# Patient Record
Sex: Female | Born: 1945 | Race: White | Hispanic: No | Marital: Married | Smoking: Former smoker
Health system: Southern US, Community
[De-identification: ages and names within clinical notes are randomized; demographics above are authoritative.]

## PROBLEM LIST (undated history)

## (undated) DIAGNOSIS — D696 Thrombocytopenia, unspecified: Secondary | ICD-10-CM

## (undated) DIAGNOSIS — M1990 Unspecified osteoarthritis, unspecified site: Secondary | ICD-10-CM

## (undated) DIAGNOSIS — E559 Vitamin D deficiency, unspecified: Secondary | ICD-10-CM

## (undated) DIAGNOSIS — I471 Supraventricular tachycardia, unspecified: Secondary | ICD-10-CM

## (undated) DIAGNOSIS — C44621 Squamous cell carcinoma of skin of unspecified upper limb, including shoulder: Secondary | ICD-10-CM

## (undated) DIAGNOSIS — M549 Dorsalgia, unspecified: Secondary | ICD-10-CM

## (undated) DIAGNOSIS — B029 Zoster without complications: Secondary | ICD-10-CM

## (undated) DIAGNOSIS — E785 Hyperlipidemia, unspecified: Secondary | ICD-10-CM

## (undated) DIAGNOSIS — M8580 Other specified disorders of bone density and structure, unspecified site: Secondary | ICD-10-CM

## (undated) HISTORY — DX: Dorsalgia, unspecified: M54.9

## (undated) HISTORY — DX: Supraventricular tachycardia: I47.1

## (undated) HISTORY — DX: Squamous cell carcinoma of skin of unspecified upper limb, including shoulder: C44.621

## (undated) HISTORY — PX: CATARACT EXTRACTION: SUR2

## (undated) HISTORY — DX: Thrombocytopenia, unspecified: D69.6

## (undated) HISTORY — DX: Zoster without complications: B02.9

## (undated) HISTORY — PX: OTHER SURGICAL HISTORY: SHX169

## (undated) HISTORY — DX: Unspecified osteoarthritis, unspecified site: M19.90

## (undated) HISTORY — DX: Vitamin D deficiency, unspecified: E55.9

## (undated) HISTORY — DX: Other specified disorders of bone density and structure, unspecified site: M85.80

## (undated) HISTORY — DX: Supraventricular tachycardia, unspecified: I47.10

## (undated) HISTORY — PX: DILATION AND CURETTAGE OF UTERUS: SHX78

## (undated) HISTORY — PX: TONSILLECTOMY: SHX5217

## (undated) HISTORY — DX: Hyperlipidemia, unspecified: E78.5

---

## 2006-07-22 ENCOUNTER — Ambulatory Visit (HOSPITAL_COMMUNITY): Admission: RE | Admit: 2006-07-22 | Discharge: 2006-07-22 | Payer: Self-pay | Admitting: *Deleted

## 2006-07-28 ENCOUNTER — Emergency Department (HOSPITAL_COMMUNITY): Admission: EM | Admit: 2006-07-28 | Discharge: 2006-07-28 | Payer: Self-pay | Admitting: Emergency Medicine

## 2006-08-09 ENCOUNTER — Encounter: Admission: RE | Admit: 2006-08-09 | Discharge: 2006-08-09 | Payer: Self-pay | Admitting: *Deleted

## 2006-08-10 ENCOUNTER — Encounter: Admission: RE | Admit: 2006-08-10 | Discharge: 2006-11-08 | Payer: Self-pay | Admitting: Orthopedic Surgery

## 2006-08-17 ENCOUNTER — Emergency Department (HOSPITAL_COMMUNITY): Admission: EM | Admit: 2006-08-17 | Discharge: 2006-08-18 | Payer: Self-pay | Admitting: Emergency Medicine

## 2006-09-17 HISTORY — PX: VERTEBROPLASTY: SHX113

## 2006-09-23 ENCOUNTER — Encounter: Admission: RE | Admit: 2006-09-23 | Discharge: 2006-09-23 | Payer: Self-pay | Admitting: Orthopedic Surgery

## 2006-09-29 ENCOUNTER — Encounter: Admission: RE | Admit: 2006-09-29 | Discharge: 2006-09-29 | Payer: Self-pay | Admitting: Orthopedic Surgery

## 2006-11-22 HISTORY — PX: CARDIOVASCULAR STRESS TEST: SHX262

## 2007-01-25 ENCOUNTER — Encounter: Admission: RE | Admit: 2007-01-25 | Discharge: 2007-02-16 | Payer: Self-pay | Admitting: Orthopedic Surgery

## 2007-09-20 ENCOUNTER — Ambulatory Visit (HOSPITAL_COMMUNITY): Admission: RE | Admit: 2007-09-20 | Discharge: 2007-09-20 | Payer: Self-pay | Admitting: Obstetrics

## 2008-05-31 IMAGING — CT CT L SPINE W/O CM
4 of 9 series · 13 of 33 positions shown, 15 images · non-contrast
Comparison: 07/28/2006

LUMBAR SPINE CT WITHOUT CONTRAST:

CLINICAL DATA: L1 compression fracture. Low back pain.
TECHNIQUE: Multidetector CT imaging of the lumbar spine was performed. 
Sagittal and coronal plane reformatted images were reconstructed from the axial
CT data, and were also reviewed.

[Series 2: l-spine helical · axial · 0.27mm/px · z∈[+100,+163]mm · 2 of 77 slices shown]
[im 26/77  bone]
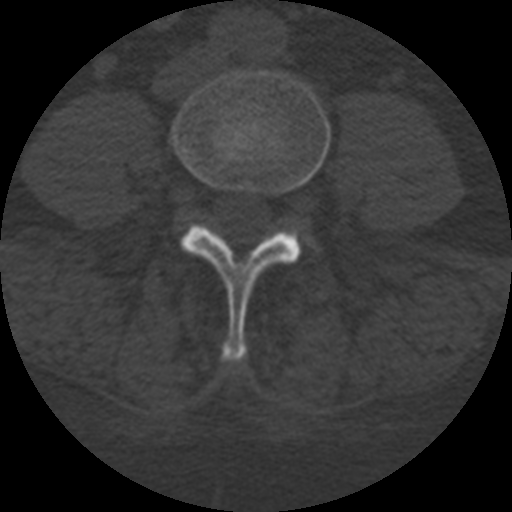
[im 51/77  bone]
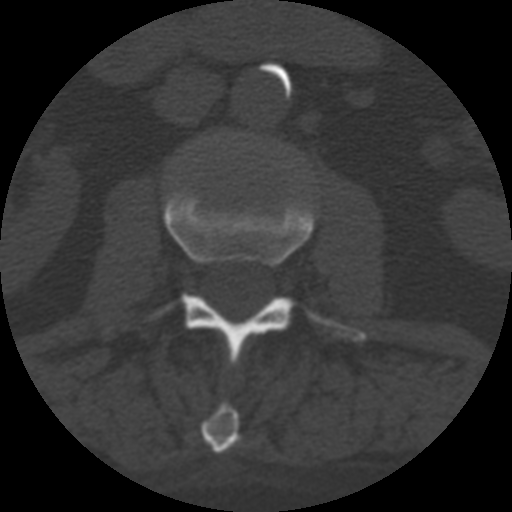

[Series 3: bone windows · axial · 0.27mm/px · z∈[+86,+180]mm · 3 of 77 slices shown, 4 images]
[im 20/77  soft-tissue]
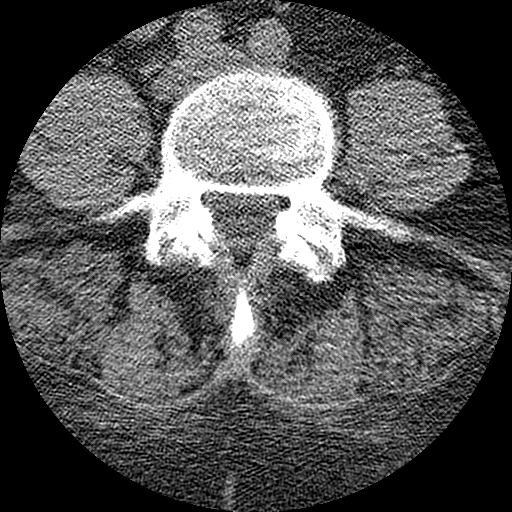
[im 20/77  bone]
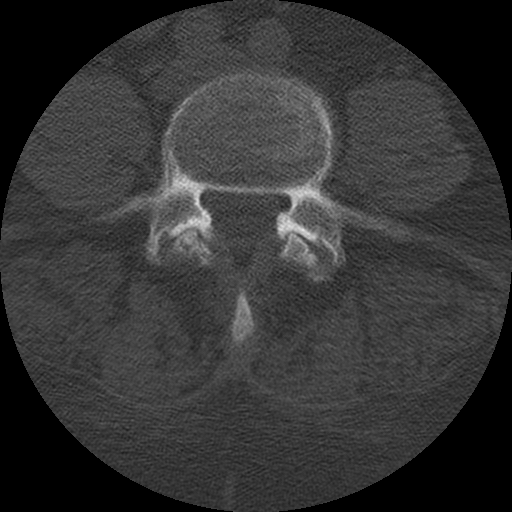
[im 39/77  bone]
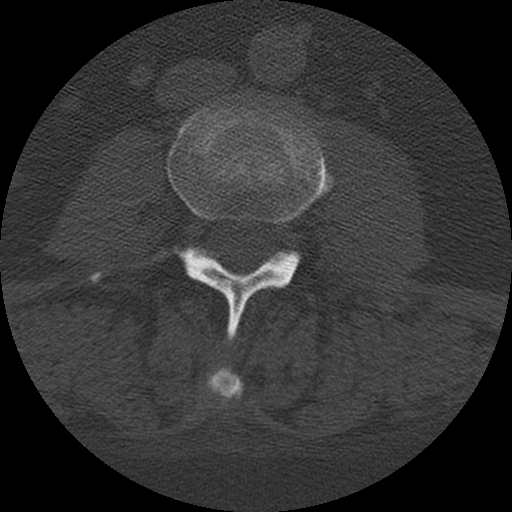
[im 58/77  bone]
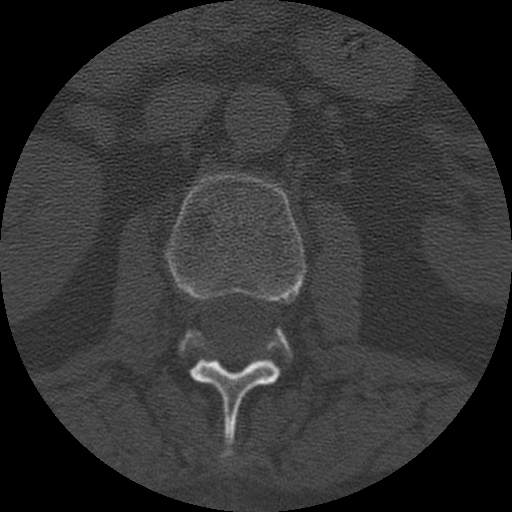

[Series 400: coronal · coronal · 0.38mm/px · 3 of 40 slices shown]
[im 8/40  bone]
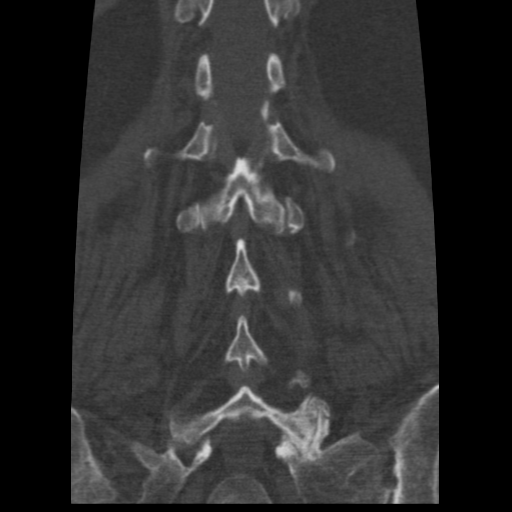
[im 16/40  bone]
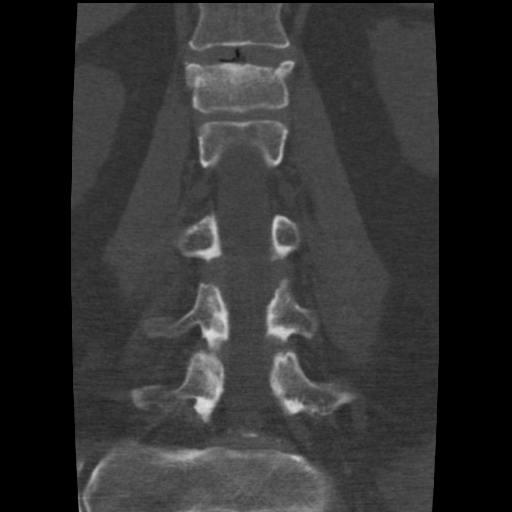
[im 24/40  bone]
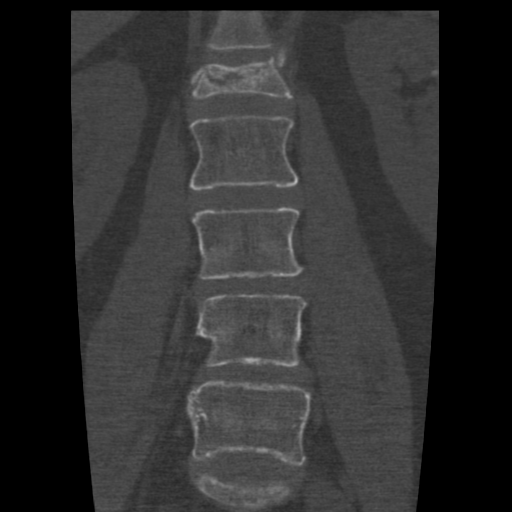

[Series 401: sagittal · sagittal · 0.38mm/px · 5 of 40 slices shown, 6 images]
[im 14/40  bone]
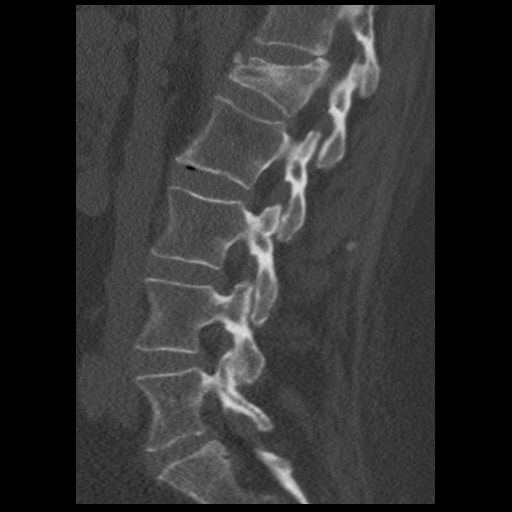
[im 17/40  bone]
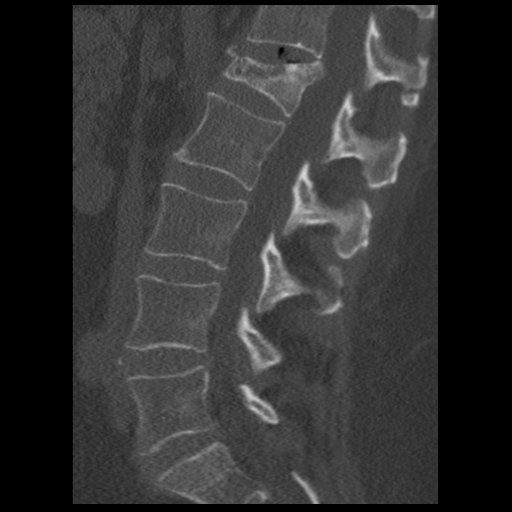
[im 20/40  soft-tissue]
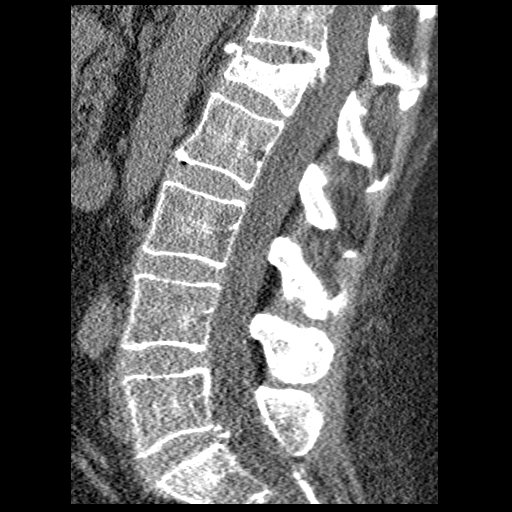
[im 20/40  bone]
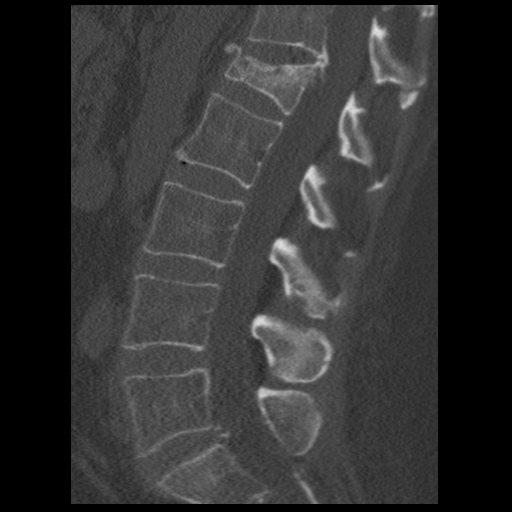
[im 23/40  bone]
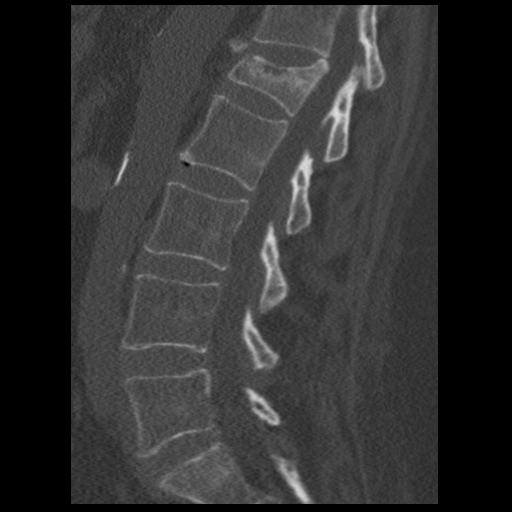
[im 27/40  bone]
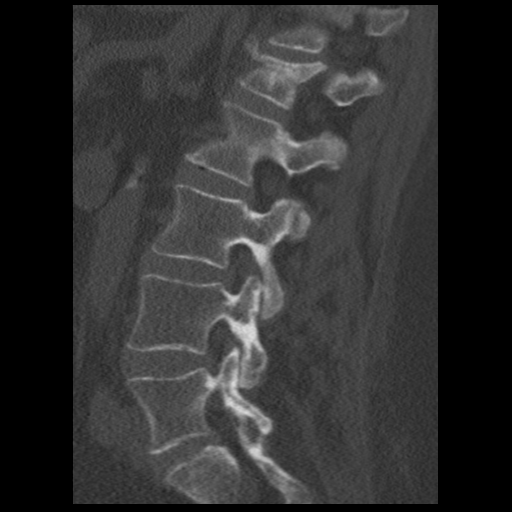

[13 of 33 positions shown; findings below may reference images not displayed]

Sagittal reformations show further loss of vertebral body height at the level of
L1. The vertebral body now has an anterior wedge configuration. Loss of anterior
cortical height is 50-75%. Loss of posterior cortical height is in the 25%
range. Posterior cortex is retropulsed approximately 6 mm into the anterior
spinal canal. The patient is developed gas within the T12-L1 interspace.

Axial Images: 

T11-12: Not Imaged in axial plane

T12-L1: Posterior bony ridging is seen at the endplates. The superior aspect of
the posterior L1 cortex is displaced 5-6 mm into the anterior canal. This
generates mild to moderate mass-effect on the anterior thecal sac.

L1-2: Broad-based disc protrusion. The central canal and neural foramina are
patent.

L2-3: No central canal or foraminal stenosis.

L3-4: Left paracentral and foraminal disc protrusion encroaches into the
inferior left neuroforamen.

L4-5: Annular bulging disc with some mild facet degeneration and thickening of
ligamentum flavum. There is mild multifactorial canal stenosis with patent
neuroforamina.

L5-S1: Small central bony spur. There is no central canal or foraminal
narrowing.

Comments: The patient is noted to have 2 nonobstructing stones in the upper pole
of the left kidney.
IMPRESSION: Further loss of vertebral body height at T1 approaching 75% loss of height
anteriorly and about 25% loss of height posteriorly. There is 5 - 6 mm of
posterior bony retropulsion into the anterior canal.

Nonobstructing left renal stones.

## 2009-12-03 ENCOUNTER — Ambulatory Visit: Payer: Self-pay | Admitting: Family Medicine

## 2009-12-06 ENCOUNTER — Ambulatory Visit: Payer: Self-pay | Admitting: Family Medicine

## 2009-12-17 DIAGNOSIS — B029 Zoster without complications: Secondary | ICD-10-CM

## 2009-12-17 HISTORY — DX: Zoster without complications: B02.9

## 2009-12-23 ENCOUNTER — Ambulatory Visit: Payer: Self-pay | Admitting: Family Medicine

## 2009-12-30 ENCOUNTER — Ambulatory Visit: Payer: Self-pay | Admitting: Family Medicine

## 2010-01-28 ENCOUNTER — Encounter
Admission: RE | Admit: 2010-01-28 | Discharge: 2010-01-28 | Payer: Self-pay | Source: Home / Self Care | Attending: Obstetrics | Admitting: Obstetrics

## 2010-02-19 ENCOUNTER — Ambulatory Visit
Admission: RE | Admit: 2010-02-19 | Discharge: 2010-02-19 | Payer: Self-pay | Source: Home / Self Care | Attending: Family Medicine | Admitting: Family Medicine

## 2010-03-09 ENCOUNTER — Encounter: Payer: Self-pay | Admitting: Radiology

## 2010-03-09 ENCOUNTER — Encounter: Payer: Self-pay | Admitting: *Deleted

## 2010-04-16 ENCOUNTER — Encounter: Payer: Self-pay | Admitting: Family Medicine

## 2010-04-16 ENCOUNTER — Encounter (INDEPENDENT_AMBULATORY_CARE_PROVIDER_SITE_OTHER): Payer: PRIVATE HEALTH INSURANCE | Admitting: Family Medicine

## 2010-04-16 DIAGNOSIS — E119 Type 2 diabetes mellitus without complications: Secondary | ICD-10-CM

## 2010-04-16 DIAGNOSIS — D649 Anemia, unspecified: Secondary | ICD-10-CM

## 2010-04-16 DIAGNOSIS — Z0000 Encounter for general adult medical examination without abnormal findings: Secondary | ICD-10-CM

## 2010-04-16 DIAGNOSIS — Z79899 Other long term (current) drug therapy: Secondary | ICD-10-CM

## 2010-04-16 DIAGNOSIS — Z23 Encounter for immunization: Secondary | ICD-10-CM

## 2010-05-19 LAB — HM COLONOSCOPY

## 2010-07-15 ENCOUNTER — Other Ambulatory Visit: Payer: PRIVATE HEALTH INSURANCE

## 2010-07-15 DIAGNOSIS — E782 Mixed hyperlipidemia: Secondary | ICD-10-CM

## 2010-07-15 DIAGNOSIS — E559 Vitamin D deficiency, unspecified: Secondary | ICD-10-CM

## 2010-07-15 DIAGNOSIS — D649 Anemia, unspecified: Secondary | ICD-10-CM

## 2010-07-16 ENCOUNTER — Other Ambulatory Visit: Payer: Self-pay | Admitting: *Deleted

## 2010-07-16 DIAGNOSIS — I10 Essential (primary) hypertension: Secondary | ICD-10-CM

## 2010-07-16 LAB — CBC WITH DIFFERENTIAL/PLATELET
Basophils Absolute: 0 10*3/uL (ref 0.0–0.1)
Basophils Relative: 0 % (ref 0–1)
Eosinophils Absolute: 0.6 10*3/uL (ref 0.0–0.7)
Lymphocytes Relative: 20 % (ref 12–46)
Lymphs Abs: 1.5 10*3/uL (ref 0.7–4.0)
MCH: 27.3 pg (ref 26.0–34.0)
MCHC: 32.6 g/dL (ref 30.0–36.0)
Monocytes Absolute: 0.5 10*3/uL (ref 0.1–1.0)
Monocytes Relative: 6 % (ref 3–12)
Neutro Abs: 4.7 10*3/uL (ref 1.7–7.7)
RDW: 17 % — ABNORMAL HIGH (ref 11.5–15.5)
WBC: 7.2 10*3/uL (ref 4.0–10.5)

## 2010-07-16 LAB — LIPID PANEL
LDL Cholesterol: 91 mg/dL (ref 0–99)
Total CHOL/HDL Ratio: 2.6 Ratio
VLDL: 27 mg/dL (ref 0–40)

## 2010-07-16 MED ORDER — LOVASTATIN 40 MG PO TABS: 40.0000 mg | ORAL_TABLET | Freq: Every day | ORAL | Status: DC

## 2010-07-17 ENCOUNTER — Ambulatory Visit (INDEPENDENT_AMBULATORY_CARE_PROVIDER_SITE_OTHER): Payer: PRIVATE HEALTH INSURANCE | Admitting: Family Medicine

## 2010-07-17 ENCOUNTER — Encounter: Payer: Self-pay | Admitting: Family Medicine

## 2010-07-17 VITALS — BP 120/74 | HR 88 | Ht 64.0 in | Wt 233.0 lb

## 2010-07-17 DIAGNOSIS — G4700 Insomnia, unspecified: Secondary | ICD-10-CM

## 2010-07-17 DIAGNOSIS — F411 Generalized anxiety disorder: Secondary | ICD-10-CM | POA: Insufficient documentation

## 2010-07-17 DIAGNOSIS — E119 Type 2 diabetes mellitus without complications: Secondary | ICD-10-CM

## 2010-07-17 DIAGNOSIS — D696 Thrombocytopenia, unspecified: Secondary | ICD-10-CM

## 2010-07-17 DIAGNOSIS — E7800 Pure hypercholesterolemia, unspecified: Secondary | ICD-10-CM

## 2010-07-17 DIAGNOSIS — D509 Iron deficiency anemia, unspecified: Secondary | ICD-10-CM | POA: Insufficient documentation

## 2010-07-17 NOTE — Progress Notes (Signed)
Subjective:    Patient ID: Kim Soto, female    DOB: 10/30/1945, 64 y.o.   MRN: [id number]  HPI Anemia follow-up:  Seeing Dr. Loreta Ave, has had EGD, colonoscopy and capsule endoscopy.  Taking a Rx iron supplement BID (patient later called with name--ferrous sulfate 325mg ).  She was also informed by Dr. Loreta Ave that she has a ventral and umbilical hernia and has concerns regarding these diagnosis.  Very stressed--has started seeing Cephus Slater (therapist).  Husband is about to lose his job, trying to move out of bad neighborhood, looking at group home for her son.  Rash on R ankle recurred yesterday, due to stress.  Gets periodically when stressed; very itchy; has Rx creams to use.  Is off the Lorazepam.  Taking Alteril (melatonin, valerian root and ?something else) and this is helping her sleep.  She has been up every 2 hours to go to the bathroom, although usually able to get back to sleep easily.  Since she is sleeping better at night, she is more active and doing more during the day, so is having more knee pain. Taking oxycodone, using just one pill daily, now earlier in the day (because active and more painful) rather than at bedtime.  Not driving after taking.  No longer needing a second pill.  Admits to dropping out of [employer] and eating poorly due to stress. Has gained 3# per her home scale (states that our last weight was 5# less than what she weighed at home).  R thumb nail is growing abnormally (due to underlying cyst).  S/p biopsy by Dr. Harlan Stains.  Still having ongoing pain, so referred to Dr. Teressa Senter for removal of the cyst, has appointment tomorrow.  Diabetes follow-up:  Blood sugars are only checked infrequently, usually <120, but hasn't been checking in the last month, when her diet has been worse.  She checks her A1c's at home, 5.7 last week. Denies hypoglycemia.  Denies polydipsia.  Having nocturia, up every 2 hours at night.  Last eye exam was earlier this month with Dr.  Elmer Picker.   Started using rubber exercise bands (saw on Dr. Neil Crouch) and is using on upper and lower body  Past Medical History  Diagnosis Date  . SVT (supraventricular tachycardia)     followed by Dr. [place]  . Hyperlipidemia   . Diabetes mellitus   . Osteopenia   . Unspecified vitamin D deficiency   . Back pain   . Arthritis     knees  . Glaucoma 2006    narrow angle, s/p laser treatment  . Shingles 12/2009    Past Surgical History  Procedure Date  . Vertebroplasty 8/08    L2  . Tonsillectomy   . Dilation and curettage of uterus     x3    History   Social History  . Marital Status: Married    Spouse Name: N/A    Number of Children: N/A  . Years of Education: N/A   Occupational History  . Not on file.   Social History Main Topics  . Smoking status: Former Smoker    Quit date: 02/16/1990  . Smokeless tobacco: Never Used  . Alcohol Use: No  . Drug Use: No  . Sexually Active: Not on file     1 PPD x 20 years   Other Topics Concern  . Not on file   Social History Narrative  . No narrative on file    Family History  Problem Relation Age of Onset  .  COPD Mother   . Heart disease Father     MI at 45, CHF  . COPD Brother   . Diabetes Brother   . Cancer Paternal Aunt     ovarian  . Diabetes Brother     Current outpatient prescriptions:alendronate (FOSAMAX) 70 MG tablet, Take 70 mg by mouth every 7 (seven) days. Take with a full glass of water on an empty stomach. , Disp: , Rfl: ;  calcium citrate-vitamin D (CITRACAL+D) 315-200 MG-UNIT per tablet, Take 2 tablets by mouth daily.  , Disp: , Rfl: ;  flecainide (TAMBOCOR) 100 MG tablet, Take 100 mg by mouth 2 (two) times daily.  , Disp: , Rfl:  lovastatin (MEVACOR) 40 MG tablet, Take 1 tablet (40 mg total) by mouth at bedtime., Disp: 30 tablet, Rfl: 2;  metFORMIN (GLUCOPHAGE) 500 MG tablet, Take 500 mg by mouth 2 (two) times daily with a meal.  , Disp: , Rfl: ;  Methylcellulose, Laxative, (CITRUCEL PO), Take 2 tablets  by mouth as needed.  , Disp: , Rfl: ;  oxyCODONE-acetaminophen (PERCOCET) 10-325 MG per tablet, Take 1 tablet by mouth 2 (two) times daily as needed.  , Disp: , Rfl:  LORazepam (ATIVAN) 1 MG tablet, Take 1 mg by mouth every 8 (eight) hours as needed.  , Disp: , Rfl:   No Known Allergies  Review of Systems Reflux only if overeats.  Denies abdominal pain.  Denies any blood in stool (black from iron).  Nocturia (q2 hours at night, fine for 4 hours at a time during the day).  Noticed some increased bruising.  No nosebleeds, visible blood in urine or stool, or other bleeding.  Denies chest pain, palpitations, swelling, or other concerns.  Denies headaches, dizziness.    Objective:   Physical Exam  Well developed, well nourished patient, in no distress BP 120/74  Pulse 88  Ht 5\' 4"  (1.626 m)  Wt 233 lb (105.688 kg)  BMI 39.99 kg/m2 Neck: No lymphadenopathy or thyromegaly, no carotid bruit Heart:  Regular rate and rhythm, no murmurs, rubs, gallops or ectopy Lungs:  Clear bilaterally, without wheezes, rales or ronchi Abdomen:  Soft, nontender, nondistended, no hepatosplenomegaly or masses, normal bowel sounds.  Obese.  Large ventral hernia noted, nontender, easily reducible.  Small umbilical hernia, nontender, easily reducible Extremities:  No clubbing, cyanosis or edema, 2+ pulses.  Neuro:  Alert and oriented x 3, cranial nerves grossly intact. Normal gait. Skin: Mild erythema at R ankle, without any discrete rash or lesions.  Occasional ecchymosis on UE Psych:  Normal mood, affect, hygiene and grooming, normal speech, eye contact  See lab results done earlier this week     Assessment & Plan:   1. Iron deficiency anemia, unspecified  CBC with Differential, Ferritin   EGD (mild erosions and HH), normal colonoscopy, and had capsule endoscopy (no report, ok per pt). Anemia resolved, on iron  2. Thrombocytopenia     New onset. Check into OTC meds to see if can affect platelets.  Re-check 1  month, sooner if increased bleeding/bruising. Hematology referral if worse  3. Type II or unspecified type diabetes mellitus without mention of complication, not stated as uncontrolled    4. Pure hypercholesterolemia    5. Anxiety state, unspecified     continue counseling with Cephus Slater  6. Insomnia     improved with use of Alteril (tryptophan, valerian, melatonin)    Anemia--has resolved.  No source found.  Only abnormality was mild gastric erosions.  Not sure  how good capsule endoscopy was (no report received).  I recommended she use the Prevacid samples given by Dr. Loreta Ave for 1-2 months.  Decrease iron to once daily.  Re-check CBC and ferritin in 2 months.  If still all okay, then can try stopping the iron entirely.  After visit, changed recommendation to f/u CBC in ONE month, rather than 2, in order to re-check the platelets sooner.  Vitamin D deficiency--adequately replaced.  Continue 2000 IU daily  DM--overall well controlled.  Needs to get back on track with her eating/food choices.  Continue counseling to help with stress reduction and coping mechanisms that do not involve eating  Knee arthritis--had originally wanted referral to pain management.  I will continue to Rx, doesn't need Rx now.  Will eventually follow up with Dr. Luiz Blare to discuss other treatment options  Ventral hernia and Umbilical hernia--patient reassured.  Signs and symptoms of herniation/strangulation reviewed.  Weight loss may help decrease size.  6 months--lipids, a1c, urine microalb 2 months--CBC and ferritin

## 2010-07-18 DIAGNOSIS — C44621 Squamous cell carcinoma of skin of unspecified upper limb, including shoulder: Secondary | ICD-10-CM

## 2010-07-18 HISTORY — DX: Squamous cell carcinoma of skin of unspecified upper limb, including shoulder: C44.621

## 2010-07-24 ENCOUNTER — Other Ambulatory Visit: Payer: Self-pay | Admitting: Orthopedic Surgery

## 2010-07-24 ENCOUNTER — Ambulatory Visit (HOSPITAL_BASED_OUTPATIENT_CLINIC_OR_DEPARTMENT_OTHER)
Admission: RE | Admit: 2010-07-24 | Discharge: 2010-07-24 | Disposition: A | Discharge status: Home / Self Care | Payer: Medicare HMO | Source: Ambulatory Visit | Attending: Orthopedic Surgery | Admitting: Orthopedic Surgery

## 2010-07-24 DIAGNOSIS — L608 Other nail disorders: Secondary | ICD-10-CM | POA: Insufficient documentation

## 2010-07-24 DIAGNOSIS — L03019 Cellulitis of unspecified finger: Secondary | ICD-10-CM | POA: Insufficient documentation

## 2010-07-25 ENCOUNTER — Telehealth: Payer: Self-pay | Admitting: Family Medicine

## 2010-07-25 NOTE — Telephone Encounter (Signed)
Okay to phone in refill--please see paper chart to see quantity she got at last refill, and refill same quantity (I can't recall quant, and not in computer). Thanks

## 2010-07-28 ENCOUNTER — Other Ambulatory Visit: Payer: Self-pay | Admitting: *Deleted

## 2010-07-28 ENCOUNTER — Other Ambulatory Visit: Payer: Self-pay

## 2010-07-28 DIAGNOSIS — G894 Chronic pain syndrome: Secondary | ICD-10-CM

## 2010-07-28 MED ORDER — OXYCODONE-ACETAMINOPHEN 10-325 MG PO TABS: 1.0000 | ORAL_TABLET | Freq: Two times a day (BID) | ORAL | Status: DC | PRN

## 2010-07-28 NOTE — Telephone Encounter (Signed)
PT PICK UP 07/28/2010

## 2010-07-29 NOTE — H&P (Signed)
NAME:  Kim Soto, Kim Soto NO.:  [id number]  MEDICAL RECORD NO.:  [id number]  LOCATION:                                 FACILITY:  PHYSICIAN:  Katy Fitch. Esau Fridman, M.D.      DATE OF BIRTH:  DATE OF ADMISSION: DATE OF DISCHARGE:                             HISTORY & PHYSICAL   PREOPERATIVE DIAGNOSIS:  Chronic dystrophic right thumbnail radial aspect with nonadherence nail plate and episodes of chronic paronychia.  POSTOPERATIVE DIAGNOSIS:  Chronic dystrophic right thumbnail radial aspect with nonadherence nail plate and episodes of chronic paronychia.  OPERATION:  Permanent resection of the dorsal intermediate and ventral nail fold and reconstruction of radial nail wall right thumb.  Also, the entire nail mechanism was sent for biopsy to rule out a neoplasm.  OPERATING SURGEON:  Katy Fitch. Annakate Soulier, MD  ASSISTANT:  Marveen Reeks Dasnoit, PA  ANESTHESIA:  Lidocaine 2% metacarpal head level block which ultimately was unsuccessful in relieving pain followed by 0.25% Marcaine radial block of right thumb.  ANESTHETIST:  Katy Fitch. 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 Bufford Buttner for evaluation and management of a chronically dystrophic right thumb nail.  Kim Soto has had have multiple episodes of drainage from her radial nail wall and a dystrophic ragged unstable nail plate.  Clinical examination revealed signs of scarring of the ventral nail matrix and radial nail wall with a friable nail plate.  We recommended permanent resection of the dorsal intermediate and ventral nail fold along the radial aspect of the nail mechanism, and we will send the nail for biopsy to be certain there is not a neoplasm of the proximal nail fold.  After informed consent, she is brought to the operating room at this time.  PROCEDURE:  Kim Soto was brought to room six of the [facility] and  placed in supine position on the operating table.  Following informed consent and Betadine prep, 2% lidocaine was infiltrated with metacarpal head level to obtain a digital block.  After more than 15 minutes, she had anesthesia of her pulp, but we could not achieve anesthesia of the dorsal aspect of the distal phalanx and dorsal nail fold.  We infiltrated an additional 2-3 mL along the pathway of the lateral antebrachial cutaneous nerves and the dorsal radial sensory nerves. Despite this, we could not achieve anesthesia.  Therefore, we requested 0.25% Marcaine without epinephrine.  A total of 2 mL of 0.25% Marcaine without epinephrine was infiltrated along the radial aspect of the thumb at the proximal phalangeal segment to obtain block of the radial proper digital nerve and the dorsal radial sensory branches.  After 5 minutes, complete anesthesia was achieved.  The right hand and arm were then prepped with Betadine soap and solution and sterilely draped.  Following exsanguination of the thumb with a gauze wrap, a 1/2-inch Penrose drain was placed in the proximal phalangeal segment of the thumb as a digital tourniquet.  With the aid of loupe magnification, we identified the area of dystrophic nail matrix.  The nail plate was incised with scissors and the scalpel and the  entire dorsal intermediate and ventral nail matrix as well as the radial nail wall was resected en bloc, placed in formalin, and passed off pathologic evaluation.  A microcurette was then used to remove any remnants of nail tissue from the distal phalanx until subcutaneous fat was visible.  Likewise, the dorsal nail fold elements were removed until subcutaneous tissue was noted.  The radial nail wall was then recontoured with a notch skin resection distally to allow proper nail wall to the reformed.  This was then reconstructed with multiple mattress sutures of 5-0 chromic.  The tourniquet was released and bleeding  controlled by direct pressure. Xeroflo was applied to the wound including the nail matrix and nail plate.  Sterile gauze and a wrist-based Coban dressing were applied. There were no apparent complications.  For aftercare, Ms. Iglesia was advised to expect the thumb to be numb for approximately 12 hours due to the Marcaine.  Thereafter, she was provided prescription for Percocet 5 mg one p.o. q.4-6 hours p.r.n. pain.  She is told to expect throbbing discomfort.     Katy Fitch Szymon Foiles, M.D.     RVS/MEDQ  D:  07/24/2010  T:  07/25/2010  Job:  [id number]  cc:   Bufford Buttner, MD  Electronically Signed by Josephine Igo M.D. on 07/29/2010 02:28:03 PM

## 2010-07-30 ENCOUNTER — Ambulatory Visit
Admission: RE | Admit: 2010-07-30 | Discharge: 2010-07-30 | Disposition: A | Discharge status: Home / Self Care | Payer: 59 | Source: Ambulatory Visit | Attending: Orthopedic Surgery | Admitting: Orthopedic Surgery

## 2010-07-30 ENCOUNTER — Encounter: Payer: Self-pay | Admitting: Family Medicine

## 2010-07-30 ENCOUNTER — Encounter (HOSPITAL_BASED_OUTPATIENT_CLINIC_OR_DEPARTMENT_OTHER)
Admission: RE | Admit: 2010-07-30 | Discharge: 2010-07-30 | Disposition: A | Discharge status: Home / Self Care | Payer: Medicare HMO | Source: Ambulatory Visit | Attending: Orthopedic Surgery | Admitting: Orthopedic Surgery

## 2010-07-30 ENCOUNTER — Other Ambulatory Visit: Payer: Self-pay | Admitting: Orthopedic Surgery

## 2010-07-30 DIAGNOSIS — Z01811 Encounter for preprocedural respiratory examination: Secondary | ICD-10-CM

## 2010-07-30 LAB — BASIC METABOLIC PANEL
CO2: 28 mEq/L (ref 19–32)
Calcium: 8.9 mg/dL (ref 8.4–10.5)
Chloride: 104 mEq/L (ref 96–112)
Creatinine, Ser: 0.65 mg/dL (ref 0.4–1.2)
Glucose, Bld: 139 mg/dL — ABNORMAL HIGH (ref 70–99)
Sodium: 140 mEq/L (ref 135–145)

## 2010-07-30 LAB — DIFFERENTIAL
Lymphs Abs: 1.4 10*3/uL (ref 0.7–4.0)
Monocytes Relative: 7 % (ref 3–12)
Neutro Abs: 5.5 10*3/uL (ref 1.7–7.7)
Neutrophils Relative %: 67 % (ref 43–77)

## 2010-07-30 LAB — CBC
Hemoglobin: 14.5 g/dL (ref 12.0–15.0)
MCH: 28.3 pg (ref 26.0–34.0)
MCV: 83.8 fL (ref 78.0–100.0)
RBC: 5.13 MIL/uL — ABNORMAL HIGH (ref 3.87–5.11)

## 2010-07-31 ENCOUNTER — Ambulatory Visit (HOSPITAL_BASED_OUTPATIENT_CLINIC_OR_DEPARTMENT_OTHER)
Admission: RE | Admit: 2010-07-31 | Discharge: 2010-07-31 | Disposition: A | Discharge status: Home / Self Care | Payer: Medicare HMO | Source: Ambulatory Visit | Attending: Orthopedic Surgery | Admitting: Orthopedic Surgery

## 2010-07-31 ENCOUNTER — Other Ambulatory Visit: Payer: Self-pay | Admitting: Orthopedic Surgery

## 2010-07-31 DIAGNOSIS — Z01810 Encounter for preprocedural cardiovascular examination: Secondary | ICD-10-CM | POA: Insufficient documentation

## 2010-07-31 DIAGNOSIS — L03019 Cellulitis of unspecified finger: Secondary | ICD-10-CM | POA: Insufficient documentation

## 2010-07-31 DIAGNOSIS — C44621 Squamous cell carcinoma of skin of unspecified upper limb, including shoulder: Secondary | ICD-10-CM | POA: Insufficient documentation

## 2010-07-31 DIAGNOSIS — Z01812 Encounter for preprocedural laboratory examination: Secondary | ICD-10-CM | POA: Insufficient documentation

## 2010-07-31 LAB — GLUCOSE, CAPILLARY

## 2010-08-05 NOTE — Op Note (Signed)
NAME:  Kim Soto, Kim Soto NO.:  [id number]  MEDICAL RECORD NO.:  [id number]  LOCATION:                                 FACILITY:  PHYSICIAN:  Katy Fitch. Yobani Schertzer, M.D. 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  POSTOPERATIVE DIAGNOSIS:  Invasive squamous cell carcinoma, chronic, perhaps existing 12 years based on further detailed history provided by Kim Soto with identification of possible periosteal involvement of distal phalanx based on frozen section studies.  Dr. Jimmy Picket, attending pathologist was unable to provide a definitive diagnosis on frozen section and due to our concerns about a close margin to the radial aspect of the tuft and diaphysis of the distal phalanx, we aborted the definitive procedure at this time in that if we have an inappropriate margin or bony periosteal involvement of the distal phalanx the treatment algorithm will require partial thumb amputation for local disease control.  OPERATION:  Wide marginal resection of right thumb invasive squamous cell carcinoma with resection of entire nail mechanism and discrete periosteal biopsies of radial diaphysis and tuft of right thumb distal phalanx followed by frozen section with Dr. Jimmy Picket, attending pathologist.  Dr. Luisa Hart was comfortable with the marked margins of the nail mechanism being free of squamous cell carcinoma, however, the deep margins and the individual periosteal biopsies sent were very fibrotic and had atypical cells present.  Dr. Luisa Hart noted evidence of chronic inflammation and on frozen section was unable to determine whether or not  there were elements of invasive squamous cell carcinoma.  Therefore, after a detailed discussion with Kim Soto's husband, Kim Soto, we elected to abort further surgery, dress the wound open, and decide on definitive care after further staging workup.  OPERATING SURGEON:  Katy Fitch. Bretton Tandy, MD  ASSISTANT:  Marveen Reeks Dasnoit, PA-C  ANESTHESIA:  General by LMA.  SUPERVISING ANESTHESIOLOGIST:  Janetta Hora. Gelene Mink, MD  INDICATIONS:  Kim Soto is a 65 year old woman who presented to my office on July 18, 2010, reporting a 13-month history of drainage from the radial aspect of her right thumb nail.  She was referred by her attending dermatologist, Dr. Barbaraann Cao who had evaluated the chronic paronychia, obtained a wound culture that grew mixed flora and sent her for a hand surgery consult.  Upon review of her clinical history and clinical examination, Kim Soto appeared to have a chronic paronychia that she reported had drained several times during the past years.  Given these circumstances, we recommended an incisional biopsy with partial ablation of her nail for full-thickness nail biopsy in an effort to control her dystrophic nail and also to obtain a biopsy to rule out neoplasm.  Dr. Colonel Bald, attending pathologist contacted my office 24 hours following surgery noting that there was likely invasive squamous cell carcinoma in the nail wall.  I sat down with Dr. Colonel Bald and did a detailed analysis of Kim Soto's biopsy.  She  had pegs of well-differentiated squamous cell carcinoma infiltrating deep to the germinal nail matrix and sterile nail matrix, but did not appear to have any periosteal involvement in this segment resected.  Imaging of her thumb was nondiagnostic.  We subsequently completed a PubMed literature search dating back to 1948 reviewing treatment of squamous cell carcinoma of the nail.  We formed a treatment algorithm expecting wide marginal resection  and probable skin grafting unless we had deep margin difficulties with the distal phalanx or evidence of any periosteal involvement.  I had a detailed informed consent with Kim Soto on the morning of her second surgery on July 31, 2010.  At that time, under detailed questioning, she reported that she had had at least one episode of drainage dating back as long as 67 years prior.  A 12-year history of chronic drainage radically changes our conceptualization of her disease process.  I advised that we would perform frozen sections in an effort to identify margin and proceed with full-thickness skin grafting if adequate margins were obtained.  The literature indicates that if bony involvement is noted or periosteal margins are of concern deletion of the distal phalanx will be required for disease control.  Also, given the recent information of the chronicity of her predicament, an oncology consult may be requested with the issue of possible sentinel node or axillary node biopsy being raised.  I will also contact resources in [place] where there is a large skin tumor registry for advice as to appropriate ways to proceed forward. After informed consent, Kim Soto is brought to the operating room at this time.  PROCEDURE:  Kim Soto was brought to room #6 of the [facility] and placed in supine position upon the operating table.  Following an anesthesia consult with Dr. Gelene Mink, general anesthesia by LMA was recommended and accepted.  Under Dr. Thornton Dales strict supervision, general anesthesia by LMA technique was induced followed by Betadine scrub and paint of the entire right upper extremity.  Sterile drapes were applied and no tourniquet utilized on the brachium.  Our attention was to harvest a skin graft from the posterior aspect of the brachium if skin grafting was indicated.  After a routine surgical time-out, the thumb was exsanguinated with a gauze  wrap and a 1/2-inch Penrose drain was placed at the base of the thumb as a digital tourniquet.  With the aid of a sterile ruler, we planned 5 mm margins from the margins of the nail as well as the area of biopsy.  The margin at the index biopsy that was of concern was the central nail biopsy.  The skin incisions were taken sharply to the level of the extensor tendon dorsally and down to the level of the periosteum radially, ulnarly, and distally at the hyponychium.  The entire nail mechanism was elevated directly off the periosteum of the distal phalanx with periosteum being removed as much as possible with the biopsy specimen.  On the radial nail wall, there was an area of chronic induration and scarring that involved the radial diaphysis of the distal phalanx.  With careful dissection under loupe magnification, I identified a small peg of tissue that appeared to be invaginating into the area of the tuft the nail mechanism was placed in saline and passed off for a frozen section examination by Dr. Jimmy Picket, attending pathologist.  The area of periosteum was isolated and placed in saline including the small peg that had infiltrated into the distal phalanx superficially.  I  contacted Dr. Luisa Hart by phone and explained my gross findings to him.  After the frozen sections were completed, he reported that our skin margins and initial nail margins appeared clear.  However, he was concerned about the degree of chronic fibrosis along the periosteum and was concerned about some atypical cells noted in the periosteal specimen.  Given these findings, we do not have a definitive margin and based on frozen section Dr. Luisa Hart was uncomfortable trying to identify a definitive margin.  I subsequently broke scrub and went to talk with Kim Soto, the patient's husband.  I advised him as to our findings and the difficulties with frozen section establishing a clear margin.  Given these  circumstances in my judgment it was appropriate at this point in time to abort further surgery, dress the wound open, and continue with a staging workup.  Information provided today raised the question of whether or not an oncology consult will be beneficial and question of whether not regional lymph node dissection was indicated would be discussed in detail.  The tourniquet was released and hemostasis was achieved by bipolar cautery under saline and direct pressure.  The wound was dressed with Xeroflo sterile gauze, sterile Kling, and Coban.  Kim Soto was awakened from general anesthesia and transferred to the recovery room with stable signs.  We will have a definitive pathology report back within approximately 48- 72 hours.     Katy Fitch Kim Soto, M.D.     RVS/MEDQ  D:  07/31/2010  T:  08/01/2010  Job:  [id number]  cc:   Lavonda Jumbo, M.D. Dr. Fawn Kirk  Electronically Signed by Josephine Igo M.D. on 08/05/2010 08:26:43 AM

## 2010-08-07 ENCOUNTER — Ambulatory Visit (HOSPITAL_BASED_OUTPATIENT_CLINIC_OR_DEPARTMENT_OTHER)
Admission: RE | Admit: 2010-08-07 | Discharge: 2010-08-07 | Disposition: A | Discharge status: Home / Self Care | Payer: Medicare HMO | Source: Ambulatory Visit | Attending: Orthopedic Surgery | Admitting: Orthopedic Surgery

## 2010-08-07 ENCOUNTER — Encounter: Payer: Self-pay | Admitting: Cardiology

## 2010-08-07 DIAGNOSIS — Z85828 Personal history of other malignant neoplasm of skin: Secondary | ICD-10-CM | POA: Insufficient documentation

## 2010-08-07 DIAGNOSIS — Z428 Encounter for other plastic and reconstructive surgery following medical procedure or healed injury: Secondary | ICD-10-CM | POA: Insufficient documentation

## 2010-08-07 LAB — GLUCOSE, CAPILLARY: Glucose-Capillary: 129 mg/dL — ABNORMAL HIGH (ref 70–99)

## 2010-08-07 LAB — POCT I-STAT, CHEM 8
BUN: 14 mg/dL (ref 6–23)
Calcium, Ion: 1.14 mmol/L (ref 1.12–1.32)
Creatinine, Ser: 0.7 mg/dL (ref 0.50–1.10)
Glucose, Bld: 145 mg/dL — ABNORMAL HIGH (ref 70–99)
TCO2: 27 mmol/L (ref 0–100)

## 2010-08-08 ENCOUNTER — Telehealth: Payer: Self-pay

## 2010-08-08 NOTE — Telephone Encounter (Signed)
Pt asked that you please give her a call she just left one Doctor office and she would like to talk with you

## 2010-08-12 NOTE — Op Note (Signed)
NAME:  Kim Soto, Kim Soto NO.:  [id number]  MEDICAL RECORD NO.:  [id number]  LOCATION:                                 FACILITY:  PHYSICIAN:  Katy Fitch. Harshitha Fretz, M.D.      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  OPERATION:  A 3 x 2 cm full-thickness skin graft harvested from posterior aspect right brachium placed over right thumb distal phalanx and nail bed status post wide marginal resection of moderately differentiated invasive squamous cell carcinoma.  SURGEON:  Katy Fitch. [name], MD  ASSISTANT:  Annye Rusk, PA-C  ANESTHESIA:  General by LMA supplemented by 2% lidocaine digital block.  SUPERVISING ANESTHESIOLOGIST:  Germaine Pomfret, MD  INDICATIONS:  Kim Soto is a 65 year old woman referred through the courtesy of Dr. Bufford Buttner and Dr. Joselyn Arrow for evaluation and management of a chronic right thumb paronychia.  On July 24, 2010, we performed a partial nail matrix resection for biopsy and obliteration of the dystrophic nail.  The permanent sections from this biopsy revealed invasive squamous cell carcinoma.  We subsequently had detailed informed consent and consultation regarding this predicament.  We then on July 31, 2010, performed a wide marginal resection with 5 mm margins.  These  margins required ablation of the entire nail mechanism.  We removed the periosteum on the dorsum of the distal phalanx as a separate layer so as to distinguish whether or not there was a margin at the bony level.  Frozen sections obtained at the time of wide resection were not definitive, therefore, we could not proceed with coverage at a single stage following only a wide resection on July 31, 2010.  Permanent sections were reviewed by Dr. Luisa Hart and Dr. Colonel Bald of our Pathology Service and found to be free of tumor.  We asked two Surgical Oncology consults in [place], one with a Orthopaedic oncologist and the second with a General Surgery oncologist, who regularly manage squamous cell carcinoma of the extremities.  Both were of the opinion that preservation of the distal phalanx of the dominant thumb was a priority and that wide marginal resection with full-thickness grafting was appropriate as a first stage procedure.  They did not feel that regional lymph node assay was indicated.  After detailed informed consent with Ms. Sherod on August 05, 2010, she is being brought back to the operating room at this time anticipating full-thickness skin grafting harvested from the lateral posterior brachium.  It was Ms. Medeiros's choice to choose this location for grafting as she desired not to have a medial brachial incision.  Preoperatively, we also discussed other outstanding issues in her health  care which included an elevated eosinophil count and a history of anemia with a negative GI workup and a history of thrombocytopenia.  We did encounter significant clinical bleeding at the time of her wide marginal resection.  Her postoperative dressing was densely saturated with blood despite our best efforts at hemostasis.  PROCEDURE:  Kim Soto was interviewed by Dr. Jairo Ben preoperatively and advised to undergo a second general anesthesia by LMA technique.  She tolerated  her procedure quite well on July 31, 2010, with the only exception being postoperative pain following a wide marginal section despite digital block.  In room 5 under Dr. Edison Pace direct supervision, general anesthesia by LMA technique was induced.  The right arm was prepped at the level of the shoulder with Betadine soap and solution and sterilely draped with sterile towels proximally and sterile stockinette distally.  No tourniquet was employed.  After a routine surgical time-out, 2% lidocaine was infiltrated in the proximal brachial level posteriorly at the site of the anticipated skin graft harvest.  The thumb dressing was removed and the thumb thoroughly irrigated with sterile saline.  Punctate bleeding was noted from the granulation tissue that had formed distally, ulnarly, and at the proximal nail fold.  An 18-gauge Angiocath trocar needle was used to fenestrate the distal phalanx five times at the area of the diaphysis to allow bleeding to nurse skin graft.  This was once again thoroughly irrigated.  A 3 x 2 cm full-thickness skin graft was harvested proximally followed by defatting to a partial-thickness graft with tenotomy scissors.  This was stored in sterile saline.  The donor site was debrided of redundant adipose tissue with a rongeur followed by layered closure with subcutaneous 3-0 Vicryl and intradermal 3-0 segmental suture with Steri- Strips.  Hemostasis was achieved by prolonged pressure.  The defatted graft was then tailored to fit the defect of the thumb and inset with multiple mattress sutures of 4-0 Prolene and multiple running and interrupted sutures of 5-0 chromic.  We high crested the graft with the 3-0 Prolene suture needle repeatedly to allow egress of hematoma.  Adaptic was placed directly over the graft and held for 10 minutes with direct pressure.  Despite this, Ms. Donalson still oozed along her wound margins.  She had a quantitative platelet defect  with her last two platelet counts 80,000 and 81,000.  Our question is whether or not she has a qualitative platelet defect as well.  After the dressing of the thumb was completed with sterile gauze and Ace bandage anchored at the wrist level, the proximal donor site was dressed with sterile gauze and a Tegaderm dressing followed by Ace wrap.  I had a detailed postoperative conference with her husband, Brandis Wixted recommending that she followup with Dr. Joselyn Arrow, her primary care physician for evaluation of her anemia and I also discussed with Dr. Lynelle Doctor on the phone post operatively the clinical issues with bleeding. The Ritacco's were advised to seek a consult with our hematologists in town   to be certain that she does not have some type of bone marrow dysfunction leading to her anemia and altered platelet count and function.  There were no other complications.     Katy Fitch Mearl Olver, M.D.     RVS/MEDQ  D:  08/07/2010  T:  08/08/2010  Job:  [id number]  cc:   Lavonda Jumbo, M.D. [facility]  Electronically Signed by Josephine Igo M.D. on 08/12/2010 09:35:38 AM

## 2010-08-13 ENCOUNTER — Encounter: Payer: Self-pay | Admitting: Family Medicine

## 2010-08-14 ENCOUNTER — Other Ambulatory Visit: Payer: PRIVATE HEALTH INSURANCE

## 2010-08-21 ENCOUNTER — Other Ambulatory Visit: Payer: Self-pay | Admitting: *Deleted

## 2010-08-21 DIAGNOSIS — I10 Essential (primary) hypertension: Secondary | ICD-10-CM

## 2010-08-21 MED ORDER — LOVASTATIN 40 MG PO TABS: 40.0000 mg | ORAL_TABLET | Freq: Every day | ORAL | Status: DC

## 2010-08-25 ENCOUNTER — Other Ambulatory Visit: Payer: Self-pay | Admitting: Oncology

## 2010-08-25 ENCOUNTER — Encounter (HOSPITAL_BASED_OUTPATIENT_CLINIC_OR_DEPARTMENT_OTHER): Payer: 59 | Admitting: Oncology

## 2010-08-25 DIAGNOSIS — C4442 Squamous cell carcinoma of skin of scalp and neck: Secondary | ICD-10-CM

## 2010-08-25 DIAGNOSIS — C4499 Other specified malignant neoplasm of skin, unspecified: Secondary | ICD-10-CM

## 2010-08-25 DIAGNOSIS — D696 Thrombocytopenia, unspecified: Secondary | ICD-10-CM

## 2010-08-25 LAB — CBC & DIFF AND RETIC
Basophils Absolute: 0 10*3/uL (ref 0.0–0.1)
EOS%: 4 % (ref 0.0–7.0)
Eosinophils Absolute: 0.3 10*3/uL (ref 0.0–0.5)
MCH: 28.9 pg (ref 25.1–34.0)
MONO#: 0.6 10*3/uL (ref 0.1–0.9)
RDW: 15.1 % — ABNORMAL HIGH (ref 11.2–14.5)
lymph#: 1.3 10*3/uL (ref 0.9–3.3)

## 2010-08-25 LAB — MORPHOLOGY
PLT EST: DECREASED
RBC Comments: NORMAL

## 2010-08-25 LAB — COMPREHENSIVE METABOLIC PANEL
BUN: 15 mg/dL (ref 6–23)
CO2: 26 mEq/L (ref 19–32)
Creatinine, Ser: 0.69 mg/dL (ref 0.50–1.10)
Glucose, Bld: 155 mg/dL — ABNORMAL HIGH (ref 70–99)
Total Bilirubin: 0.3 mg/dL (ref 0.3–1.2)

## 2010-08-25 LAB — LACTATE DEHYDROGENASE: LDH: 167 U/L (ref 94–250)

## 2010-08-25 LAB — RETICULOCYTES (CHCC): RBC.: 5.31 MIL/uL — ABNORMAL HIGH (ref 3.87–5.11)

## 2010-08-26 ENCOUNTER — Other Ambulatory Visit: Payer: Self-pay | Admitting: Oncology

## 2010-08-26 DIAGNOSIS — D696 Thrombocytopenia, unspecified: Secondary | ICD-10-CM

## 2010-08-28 ENCOUNTER — Other Ambulatory Visit: Payer: Self-pay | Admitting: Oncology

## 2010-08-28 ENCOUNTER — Ambulatory Visit (HOSPITAL_COMMUNITY)
Admission: RE | Admit: 2010-08-28 | Discharge: 2010-08-28 | Disposition: A | Discharge status: Home / Self Care | Payer: Medicare HMO | Source: Ambulatory Visit | Attending: Oncology | Admitting: Oncology

## 2010-08-28 DIAGNOSIS — D696 Thrombocytopenia, unspecified: Secondary | ICD-10-CM | POA: Insufficient documentation

## 2010-08-28 DIAGNOSIS — C4442 Squamous cell carcinoma of skin of scalp and neck: Secondary | ICD-10-CM | POA: Insufficient documentation

## 2010-09-02 ENCOUNTER — Encounter: Payer: Self-pay | Admitting: Family Medicine

## 2010-09-03 ENCOUNTER — Encounter: Payer: Self-pay | Admitting: Family Medicine

## 2010-09-03 ENCOUNTER — Ambulatory Visit (INDEPENDENT_AMBULATORY_CARE_PROVIDER_SITE_OTHER): Payer: Medicare HMO | Admitting: Family Medicine

## 2010-09-03 VITALS — BP 150/94 | HR 80 | Ht 64.0 in | Wt 227.0 lb

## 2010-09-03 DIAGNOSIS — D696 Thrombocytopenia, unspecified: Secondary | ICD-10-CM | POA: Insufficient documentation

## 2010-09-03 DIAGNOSIS — M545 Low back pain: Secondary | ICD-10-CM

## 2010-09-03 DIAGNOSIS — C44621 Squamous cell carcinoma of skin of unspecified upper limb, including shoulder: Secondary | ICD-10-CM

## 2010-09-03 DIAGNOSIS — G8929 Other chronic pain: Secondary | ICD-10-CM | POA: Insufficient documentation

## 2010-09-03 MED ORDER — OXYCODONE-ACETAMINOPHEN 7.5-325 MG PO TABS: 1.0000 | ORAL_TABLET | Freq: Four times a day (QID) | ORAL | Status: DC | PRN

## 2010-09-03 NOTE — Patient Instructions (Signed)
Don't rush too quickly to imodium after an episode of diarrhea Limit dairy after episodes of diarrhea  Call when pain medication refills are needed--I will need to know if you're still having pain related to your thumb (and how often), or if back to just normal back pain.  I will also need to know if you are doing well with the 7.5mg  dose, or if you need to be changed back to the 10mg .

## 2010-09-03 NOTE — Progress Notes (Signed)
Patient presents for refil of oxycodone.  Is requiring 2 pills/day since her thumb surgery (see below).  Dr. Teressa Senter had rx'd oxycodone 5/325 and she needed to take 2 because it didn't control the pain.  Subsequent rx from him was for 7.5/325 and this seems to work okay.  She previously took 10/325 from me, but only took it at bedtime, and occasionally during the week (ie church), getting #35/month.  Had 3 surgeries on R thumb for squamous cell cancer (Dr. Gomez Cleverly last of which was grafting skin from elbow).  She has had issues with low platelets, and high eosinophils, and was referred to Dr. Cyndie Chime.  She had u/s of spleen (normal per pt).  Lovastatin was stopped for a month (has f/u later this month).  She was told to stop all medication except for Metformin, Flecainide, alendronate and iron.  Having a problem with constipation due to iron and increased narcotic.  Using Dulcolax prn with good results.  Stress causes her to have diarrhea, and uses imodium prn.  She had severe bout of diarrhea when she found out her daughter's cat has diabetes. She has been taking imodium at first onset of diarrhea.  Denies blood in stool or significant abdominal pain.  Past Medical History  Diagnosis Date  . SVT (supraventricular tachycardia)     followed by Dr. [place]  . Hyperlipidemia   . Diabetes mellitus   . Osteopenia   . Unspecified vitamin D deficiency   . Back pain   . Arthritis     knees  . Glaucoma 2006    narrow angle, s/p laser treatment  . Shingles 12/2009  . SCC (squamous cell carcinoma), hand 07/2010    R thumb nail    Past Surgical History  Procedure Date  . Vertebroplasty 8/08    L2  . Tonsillectomy   . Dilation and curettage of uterus     x3    History   Social History  . Marital Status: Married    Spouse Name: N/A    Number of Children: N/A  . Years of Education: N/A   Occupational History  . Not on file.   Social History Main Topics  . Smoking status: Former  Smoker    Quit date: 02/16/1990  . Smokeless tobacco: Never Used  . Alcohol Use: No  . Drug Use: No  . Sexually Active: Not on file     1 PPD x 20 years   Other Topics Concern  . Not on file   Social History Narrative  . No narrative on file    Family History  Problem Relation Age of Onset  . COPD Mother   . Heart disease Father     MI at 31, CHF  . COPD Brother   . Diabetes Brother   . Cancer Paternal Aunt     ovarian  . Diabetes Brother    Current Outpatient Prescriptions on File Prior to Visit  Medication Sig Dispense Refill  . alendronate (FOSAMAX) 70 MG tablet Take 70 mg by mouth every 7 (seven) days. Take with a full glass of water on an empty stomach.       . flecainide (TAMBOCOR) 100 MG tablet Take 100 mg by mouth 2 (two) times daily.        . metFORMIN (GLUCOPHAGE) 500 MG tablet Take 500 mg by mouth 2 (two) times daily with a meal.        . calcium citrate-vitamin D (CITRACAL+D) 315-200 MG-UNIT per tablet Take 2  tablets by mouth daily.        Marland Kitchen LORazepam (ATIVAN) 1 MG tablet Take 1 mg by mouth every 8 (eight) hours as needed.          Allergies  Allergen Reactions  . Caffeine Other (See Comments)    SVT   ROS: no fevers ,URI symptoms, SOB, chest pain, skin rash.  +GI complaints--see HPI.    PHYSICAL EXAM: BP 150/94  Pulse 80  Ht 5\' 4"  (1.626 m)  Wt 227 lb (102.967 kg)  BMI 38.96 kg/m2 (initial BP 170/100) Pleasant, obese female in no distress R elbow: WHSS R thumb--bandaged  ASSESSMENT/PLAN: 1. Chronic low back pain   2. Squamous cell cancer of skin of finger    R thumb  3. Thrombocytopenia   Diarrhea/constipation--likely component of irritable bowel syndrome, but constipation contributed by narcotics and iron.  Continue stool softener, high fiber diet.  Don't rush too quickly to use imodium after an episode of diarrhea. Limit dairy intake after episodes of diarrhea Thrombocytopenia--per Dr. Cyndie Chime.  Refilled oxycodone at 7.5mg  dose, #60  (higher quantity to account for increased frequency of use related to surgery)

## 2010-09-09 ENCOUNTER — Other Ambulatory Visit: Payer: Self-pay | Admitting: Cardiology

## 2010-09-09 MED ORDER — FLECAINIDE ACETATE 100 MG PO TABS: 100.0000 mg | ORAL_TABLET | Freq: Two times a day (BID) | ORAL | Status: DC

## 2010-09-09 NOTE — Telephone Encounter (Signed)
Needs 16 pills only of "flecainide" 100mg /2x daily - [facility] [address]. Patient is seeing another MD that is thinking of dc some of her meds and she does not want to be stuck with meds she will not be using if this is one of them.Chart in box.

## 2010-09-09 NOTE — Telephone Encounter (Signed)
Called stating she has had a lot of medical issues; is anemic,had cancer removed from under thumb nail. Is seeing Dr. Cyndie Chime on 7/31 to try to pinpoint cause of anemia. He may stop Flecainide. Wants only 30 days supply in case he does stop it. Advised her that Dr. [place] will be here on Tues and if Dr. Cyndie Chime needs to speak w/ him he would be here. Sent Rx in.

## 2010-09-16 ENCOUNTER — Other Ambulatory Visit: Payer: PRIVATE HEALTH INSURANCE

## 2010-09-16 ENCOUNTER — Other Ambulatory Visit: Payer: Self-pay | Admitting: Oncology

## 2010-09-16 ENCOUNTER — Encounter (HOSPITAL_BASED_OUTPATIENT_CLINIC_OR_DEPARTMENT_OTHER): Payer: Medicare HMO | Admitting: Oncology

## 2010-09-16 DIAGNOSIS — C4499 Other specified malignant neoplasm of skin, unspecified: Secondary | ICD-10-CM

## 2010-09-16 DIAGNOSIS — D696 Thrombocytopenia, unspecified: Secondary | ICD-10-CM

## 2010-09-16 DIAGNOSIS — C4442 Squamous cell carcinoma of skin of scalp and neck: Secondary | ICD-10-CM

## 2010-09-16 LAB — CBC WITH DIFFERENTIAL/PLATELET
Basophils Absolute: 0 10*3/uL (ref 0.0–0.1)
EOS%: 4 % (ref 0.0–7.0)
HCT: 44 % (ref 34.8–46.6)
HGB: 14.9 g/dL (ref 11.6–15.9)
MCH: 28.7 pg (ref 25.1–34.0)
MCHC: 33.8 g/dL (ref 31.5–36.0)
MCV: 84.8 fL (ref 79.5–101.0)
MONO%: 7.2 % (ref 0.0–14.0)
NEUT%: 67.5 % (ref 38.4–76.8)
RDW: 14.2 % (ref 11.2–14.5)

## 2010-09-17 LAB — EPSTEIN-BARR VIRUS VCA, IGM: EBV VCA IgM: 0.05 {ISR}

## 2010-09-17 LAB — EPSTEIN-BARR VIRUS EARLY D ANTIGEN ANTIBODY, IGG: EBV EA IgG: 0.37 {ISR}

## 2010-09-23 ENCOUNTER — Other Ambulatory Visit: Payer: Self-pay | Admitting: Obstetrics

## 2010-09-23 DIAGNOSIS — M8580 Other specified disorders of bone density and structure, unspecified site: Secondary | ICD-10-CM

## 2010-09-25 ENCOUNTER — Other Ambulatory Visit: Payer: Self-pay | Admitting: Family Medicine

## 2010-09-25 ENCOUNTER — Telehealth: Payer: Self-pay

## 2010-09-25 ENCOUNTER — Other Ambulatory Visit: Payer: Medicare HMO

## 2010-09-25 DIAGNOSIS — N390 Urinary tract infection, site not specified: Secondary | ICD-10-CM

## 2010-09-25 LAB — POCT URINALYSIS DIPSTICK
Bilirubin, UA: NEGATIVE
Glucose, UA: NEGATIVE
Ketones, UA: NEGATIVE
Spec Grav, UA: 1.025
Urobilinogen, UA: NEGATIVE

## 2010-09-25 MED ORDER — SULFAMETHOXAZOLE-TRIMETHOPRIM 800-160 MG PO TABS: 1.0000 | ORAL_TABLET | Freq: Two times a day (BID) | ORAL | Status: AC

## 2010-09-25 NOTE — Progress Notes (Signed)
Her urine was brought in which was positive for leukocytes and blood. Culture was taken and she will be placed on Septra

## 2010-09-25 NOTE — Progress Notes (Signed)
Addended by: Lavell Islam on: 09/25/2010 04:59 PM   Modules accepted: Orders

## 2010-09-25 NOTE — Telephone Encounter (Signed)
Called pt to let her know med was called in had to leave message septra and urine cultured

## 2010-09-28 LAB — URINE CULTURE: Colony Count: 75000

## 2010-09-29 ENCOUNTER — Encounter: Payer: Self-pay | Admitting: Cardiology

## 2010-09-29 ENCOUNTER — Other Ambulatory Visit: Payer: Medicare HMO

## 2010-10-01 ENCOUNTER — Other Ambulatory Visit: Payer: Self-pay | Admitting: *Deleted

## 2010-10-09 ENCOUNTER — Telehealth: Payer: Self-pay | Admitting: Family Medicine

## 2010-10-09 ENCOUNTER — Other Ambulatory Visit: Payer: Self-pay | Admitting: Family Medicine

## 2010-10-09 ENCOUNTER — Encounter: Payer: Self-pay | Admitting: Cardiology

## 2010-10-09 ENCOUNTER — Ambulatory Visit (INDEPENDENT_AMBULATORY_CARE_PROVIDER_SITE_OTHER): Payer: Medicare HMO | Admitting: Cardiology

## 2010-10-09 DIAGNOSIS — M545 Low back pain, unspecified: Secondary | ICD-10-CM

## 2010-10-09 DIAGNOSIS — I471 Supraventricular tachycardia, unspecified: Secondary | ICD-10-CM

## 2010-10-09 DIAGNOSIS — I498 Other specified cardiac arrhythmias: Secondary | ICD-10-CM

## 2010-10-09 DIAGNOSIS — E785 Hyperlipidemia, unspecified: Secondary | ICD-10-CM

## 2010-10-09 MED ORDER — OXYCODONE-ACETAMINOPHEN 7.5-325 MG PO TABS: 1.0000 | ORAL_TABLET | Freq: Four times a day (QID) | ORAL | Status: DC | PRN

## 2010-10-09 NOTE — Assessment & Plan Note (Signed)
She has a long-standing history of supraventricular tachycardia. She cannot recall but does think she was tried on verapamil and a beta blocker in the past. She has been on flecainide for 15 years with an excellent response. We discussed coming off the medication to see if this would affect her thrombocytopenia. We could try to manage her with a beta blocker in the interim. There is a risk that she could have breakthrough on beta blocker therapy. After discussion the patient states that she really doesn't want to come off of this therapy. She wants to wait and see if there is any change in her platelet count off statin therapy. There is some consideration about stopping her metformin. If hematology feels that it is really necessary I think we can manage with beta blockers for a period of time and see how she does.

## 2010-10-09 NOTE — Progress Notes (Signed)
Kim Soto Date of Birth: 1945-08-22   History of Present Illness: Kim Soto he is seen today for followup of supraventricular tachycardia. She has a history of supraventricular tachycardia dating back at least since age 65. She has been on flecainide therapy for the past 15 years with excellent control. At one point 5 years ago we tried to reduce her dose but this resulted in breakthrough and she has been on her current dose for many years. She has been diagnosed with thrombocytopenia and is seeing Dr. Cyndie Chime. 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 There was some consideration about stopping her flecainide to see if this would affect her platelet count. She has been off of lovastatin for the past month. She denies any chest pain, shortness of breath, or palpitations.  Current Outpatient Prescriptions on File Prior to Visit  Medication Sig Dispense Refill  . alendronate (FOSAMAX) 70 MG tablet Take 70 mg by mouth every 7 (seven) days. Take with a full glass of water on an empty stomach.       . calcium citrate-vitamin D (CITRACAL+D) 315-200 MG-UNIT per tablet Take 2 tablets by mouth daily.        [name] Sodium 30-100 MG CAPS Take 50 mg by mouth.        . Cholecalciferol (VITAMIN D3) 2000 UNITS TABS Take by mouth.        . Doxylamine Succinate, Sleep, (SLEEP AID PO) Take by mouth. Alteril All Natural       . Ferrous Sulfate (IRON) 325 (65 FE) MG TABS Take 1 tablet by mouth daily.        . fish oil-omega-3 fatty acids 1000 MG capsule Take 2 g by mouth daily.        . flecainide (TAMBOCOR) 100 MG tablet Take 1 tablet (100 mg total) by mouth 2 (two) times daily.  60 tablet  5  . loperamide (IMODIUM) 2 MG capsule Take 4 mg by mouth as needed.       Marland Kitchen LORazepam (ATIVAN) 1 MG tablet Take 1 mg by mouth every 8 (eight) hours as needed.       . lovastatin (MEVACOR) 40 MG tablet Take 40 mg by mouth at bedtime.        . metFORMIN (GLUCOPHAGE) 500 MG tablet Take 500 mg by mouth 2 (two)  times daily with a meal.        . Multiple Vitamins-Minerals (MACULAR VITAMIN BENEFIT PO) Take by mouth 2 (two) times daily.        Marland Kitchen DISCONTD: oxyCODONE-acetaminophen (PERCOCET) 7.5-325 MG per tablet Take 1 tablet by mouth every 6 (six) hours as needed for pain.  60 tablet  0    Allergies  Allergen Reactions  . Caffeine Other (See Comments)    SVT    Past Medical History  Diagnosis Date  . SVT (supraventricular tachycardia)     followed by Dr. [place]  . Hyperlipidemia   . Diabetes mellitus   . Osteopenia   . Unspecified vitamin D deficiency   . Back pain   . Arthritis     knees  . Glaucoma 2006    narrow angle, s/p laser treatment  . Shingles 12/2009  . 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 Thrombocytopenia     Past Surgical History  Procedure Date  . Vertebroplasty 8/08    L2  . Tonsillectomy   . Dilation and curettage of uterus     x3  .  Cataract extraction   . Cardiovascular stress test 11/22/2006    EF 71%  . Thumb surgery     x3    History  Smoking status  . Former Smoker  . Quit date: 02/16/1990  Smokeless tobacco  . Never Used    History  Alcohol Use No    Family History  Problem Relation Age of Onset  . COPD Mother   . Heart disease Father     MI at 72, CHF  . COPD Brother   . Diabetes Brother   . Cancer Paternal Aunt     ovarian  . Diabetes Brother     Review of Systems: The review of systems is positive for thrombocytopenia. Her last platelet count was 65,000. She has had no bleeding problems. All other systems were reviewed and are negative.  Physical Exam: BP 136/90  Pulse 95  Ht 5\' 3"  (1.6 m)  Wt 231 lb (104.781 kg)  BMI 40.92 kg/m2 She is a pleasant white female in no acute distress. She is normocephalic, atraumatic. Pupils are equal round and reactive to light accommodation. Extraocular movements are full. Oropharynx is clear. Neck is supple without JVD, adenopathy, thyromegaly, or bruits. Her lungs  are clear. Cardiac exam reveals a regular rate and rhythm without gallop, murmur, or click. Abdomen is soft and nontender. She has no edema. There are no petechiae or bruises. She has no edema. Pedal pulses are good. Neurologic exam is nonfocal. LABORATORY DATA: ECG demonstrates normal sinus rhythm with a right bundle branch block.  Assessment / Plan:

## 2010-10-09 NOTE — Telephone Encounter (Signed)
Spoke with patient, she would like to continue on the 7.5/325 BID, I printed rx and will have Dr.Knapp sign and patient to pick up.

## 2010-10-09 NOTE — Telephone Encounter (Signed)
I only feel comfortable with authorizing #35 of the 10/325. If she is still having a lot of pain, I'm happy to continue the 7.5/325 #60 for BID use, but not to increase the dose

## 2010-10-09 NOTE — Telephone Encounter (Signed)
Called patient, she stated that you are correct, she was given 10/325 #35 by you and the surgeon(thumb surgeon) changed her to 7.5/325 BID. Patient is requesting the dose of 10/325 #60 BID. Are you okay with that or should she schedule an appt?

## 2010-10-09 NOTE — Telephone Encounter (Signed)
Please check with pt.  I previously gave her 10/325 mg #35, which she would take just at bedtime and prior to church.  Due to her recent thumb surgery, she had been changed to a different regimen, and was taking the 7.5/325 BID.  I want to verify that we are going back to previous regimen at the higher dose, with #35/month, as before.  This is okay with me

## 2010-10-09 NOTE — Patient Instructions (Signed)
We will continue your current therapy.  I will communicate with Dr. Cyndie Chime concerning your Flecainide.  I will see you again in one year.

## 2010-10-15 ENCOUNTER — Ambulatory Visit
Admission: RE | Admit: 2010-10-15 | Discharge: 2010-10-15 | Disposition: A | Discharge status: Home / Self Care | Payer: Medicare HMO | Source: Ambulatory Visit | Attending: Obstetrics | Admitting: Obstetrics

## 2010-10-15 DIAGNOSIS — M8580 Other specified disorders of bone density and structure, unspecified site: Secondary | ICD-10-CM

## 2010-10-16 ENCOUNTER — Other Ambulatory Visit (INDEPENDENT_AMBULATORY_CARE_PROVIDER_SITE_OTHER): Payer: Medicare HMO

## 2010-10-16 ENCOUNTER — Telehealth: Payer: Self-pay | Admitting: *Deleted

## 2010-10-16 DIAGNOSIS — R82998 Other abnormal findings in urine: Secondary | ICD-10-CM

## 2010-10-16 DIAGNOSIS — R8290 Unspecified abnormal findings in urine: Secondary | ICD-10-CM

## 2010-10-16 LAB — POCT URINALYSIS DIPSTICK
Nitrite, UA: NEGATIVE
Protein, UA: NEGATIVE
Spec Grav, UA: 1.015
Urobilinogen, UA: NEGATIVE
pH, UA: 5

## 2010-10-16 NOTE — Telephone Encounter (Signed)
Patient will come back in to provide urine sample to be sent off for culture. Patient states that the only symptom she is having, which is the only symptom she has had is urinary frequency during the night.

## 2010-10-16 NOTE — Telephone Encounter (Signed)
Patient was here to give urine sample, I sent you the results but I cannot find the original visit where UA was ordered. Patient states that she mentioned to you that she was having urinary frequency during the night. States she is not having any other symptoms. There was not enough urine given to culture but patient said she would be glad to come back later if needed.

## 2010-10-16 NOTE — Telephone Encounter (Signed)
She had lowgrade UTI documented earlier this month.  Please have her give additional specimen to send for culture, and document if she is having any symptoms (fever, urinary frequency, dysuria)

## 2010-10-22 ENCOUNTER — Telehealth: Payer: Self-pay | Admitting: *Deleted

## 2010-10-22 NOTE — Telephone Encounter (Signed)
Called patient to inform her that her urine showed no infection. Patient stated that she was glad that there was no infection but wanted to know why she was still getting up every 2 hours during the night to urinate. Please advise.

## 2010-10-22 NOTE — Telephone Encounter (Signed)
Recommend OV for further discussion of this problem. In the interim, ensure she isn't drinking much past 7pm in the evening, avoid caffeine

## 2010-10-27 ENCOUNTER — Telehealth: Payer: Self-pay | Admitting: *Deleted

## 2010-10-27 NOTE — Telephone Encounter (Signed)
Spoke with patient to ensure that she was not drinking liquids past 7pm, states she doesn't. She also states that she avoids caffeine. Pt scheduled for 10/30/10 @ 11:15 to discuss.

## 2010-10-30 ENCOUNTER — Ambulatory Visit (INDEPENDENT_AMBULATORY_CARE_PROVIDER_SITE_OTHER): Payer: Medicare HMO | Admitting: Family Medicine

## 2010-10-30 ENCOUNTER — Encounter: Payer: Self-pay | Admitting: Family Medicine

## 2010-10-30 VITALS — BP 128/80 | HR 80 | Ht 63.0 in | Wt 228.0 lb

## 2010-10-30 DIAGNOSIS — G4700 Insomnia, unspecified: Secondary | ICD-10-CM

## 2010-10-30 DIAGNOSIS — E119 Type 2 diabetes mellitus without complications: Secondary | ICD-10-CM

## 2010-10-30 DIAGNOSIS — R350 Frequency of micturition: Secondary | ICD-10-CM

## 2010-10-30 MED ORDER — METFORMIN HCL 500 MG PO TABS: 500.0000 mg | ORAL_TABLET | Freq: Two times a day (BID) | ORAL | Status: DC

## 2010-10-30 NOTE — Progress Notes (Signed)
Urinary frequency just at night--getting up every 2 hours until 5am, then hourly after that.  The urge to void is what is waking her up at night.  Voids normal amounts (not small).  During the day, she can easily hold her urine for at least 4 hours.  Started Atkins Diet, has lost 6 pounds/2-3 weeks.  Has recently cut back fluid intake past 7pm, other than to take medications, including her sleep aid (Alteril, OTC). Her nocturia has been a problem for many years. She was treated for lowgrade UTI (75K E coli), and repeat urine culture was normal, but didn't notice any improvement in her symptoms.  She stopped her lovastatin temporarily, while undergoing evaluation by Dr. Cyndie Chime for her low platelets.  Dr. Gean Quint recent DEXA scan, and was notified to f/u with Dr. Algie Coffer for worsening bone density.  Has diagnosis of osteoporosis.  She admits being noncompliant with Fosamax, during the time of her thumb surgery.  Likely hasn't taken it regularly for 2 months.  Also hasn't been taking her vitamins religiously.  Husband is interviewing for a job in [place] today.  She has family in [place], but isn't sure she really wants to move. Brother died unexpectedly 2 weeks ago.  He was being treated for infection in testicle, had some complaints of abdominal pain.  Had ascites, and colon was punctured during paracentesis.  Has appt with her therapist later today  Past Medical History  Diagnosis Date  . SVT (supraventricular tachycardia)     followed by Dr. Swaziland  . Hyperlipidemia   . Diabetes mellitus   . Osteopenia   . Unspecified vitamin D deficiency   . Back pain   . Arthritis     knees  . Glaucoma 2006    narrow angle, s/p laser treatment  . Shingles 12/2009  . 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 Thrombocytopenia     Past Surgical History  Procedure Date  . Vertebroplasty 8/08    L2  . Tonsillectomy   . Dilation and curettage of uterus     x3  . Cataract  extraction   . Cardiovascular stress test 11/22/2006    EF 71%  . Thumb surgery     x3    History   Social History  . Marital Status: Married    Spouse Name: N/A    Number of Children: 3  . Years of Education: N/A   Occupational History  . homemaker    Social History Main Topics  . Smoking status: Former Smoker    Quit date: 02/16/1990  . Smokeless tobacco: Never Used  . Alcohol Use: No  . Drug Use: No  . Sexually Active: Not on file     1 PPD x 20 years   Other Topics Concern  . Not on file   Social History Narrative  . No narrative on file    Family History  Problem Relation Age of Onset  . COPD Mother   . Heart disease Father     MI at 47, CHF  . COPD Brother   . Diabetes Brother   . Cancer Paternal Aunt     ovarian  . Diabetes Brother     Current outpatient prescriptions:alendronate (FOSAMAX) 70 MG tablet, Take 70 mg by mouth every 7 (seven) days. Take with a full glass of water on an empty stomach. , Disp: , Rfl: ;  calcium citrate-vitamin D (CITRACAL+D) 315-200 MG-UNIT per tablet, Take 2 tablets  by mouth daily.  , Disp: , Rfl: ;  Casanthranol-Docusate Sodium 30-100 MG CAPS, Take 50 mg by mouth.  , Disp: , Rfl:  Cholecalciferol (VITAMIN D3) 2000 UNITS TABS, Take by mouth.  , Disp: , Rfl: ;  Doxylamine Succinate, Sleep, (SLEEP AID PO), Take by mouth. Alteril All Natural , Disp: , Rfl: ;  Ferrous Sulfate (IRON) 325 (65 FE) MG TABS, Take 1 tablet by mouth daily.  , Disp: , Rfl: ;  fish oil-omega-3 fatty acids 1000 MG capsule, Take 2 g by mouth daily.  , Disp: , Rfl:  flecainide (TAMBOCOR) 100 MG tablet, Take 1 tablet (100 mg total) by mouth 2 (two) times daily., Disp: 60 tablet, Rfl: 5;  loperamide (IMODIUM) 2 MG capsule, Take 4 mg by mouth as needed. , Disp: , Rfl: ;  LORazepam (ATIVAN) 1 MG tablet, Take 1 mg by mouth every 8 (eight) hours as needed. , Disp: , Rfl: ;  metFORMIN (GLUCOPHAGE) 500 MG tablet, Take 1 tablet (500 mg total) by mouth 2 (two) times daily with  a meal., Disp: 180 tablet, Rfl: 1 Multiple Vitamins-Minerals (MACULAR VITAMIN BENEFIT PO), Take by mouth 2 (two) times daily.  , Disp: , Rfl: ;  oxyCODONE-acetaminophen (PERCOCET) 7.5-325 MG per tablet, Take 1 tablet by mouth every 6 (six) hours as needed for pain., Disp: 60 tablet, Rfl: 0;  DISCONTD: metFORMIN (GLUCOPHAGE) 500 MG tablet, Take 500 mg by mouth 2 (two) times daily with a meal.  , Disp: , Rfl:   Allergies  Allergen Reactions  . Caffeine Other (See Comments)    SVT   ROS:  Denies fever, URI symptoms.  Denies dysuria, hematuria. + urinary frequency at night. +knee pain  PHYSICAL EXAM: BP 128/80  Pulse 80  Ht 5\' 3"  (1.6 m)  Wt 228 lb (103.42 kg)  BMI 40.39 kg/m2 Pleasant, obese female in no distress Back: no CVA tenderness Heart: regular rate and rhythm Lungs: clear Abdomen: soft, nontender Skin: no rash  ASSESSMENT/PLAN 1. Urinary frequency    2. Type II or unspecified type diabetes mellitus without mention of complication, not stated as uncontrolled  metFORMIN (GLUCOPHAGE) 500 MG tablet  3. Insomnia     Nocturnal urinary frequency--discussed further behavioral changes (increase fluid during the day, cut back further at night) Consider medication such as Detrol--would want her hematologist approval, as they are investigating her low platelets and eliminating meds--pt prefers to avoid meds at this point.   Consider urology referral (for hemodynamics, possibly PT) Consider possibility that Alteril supplement could be related--recommended a trial of a night or two without this medication to see if symptoms improve.  Can use other meds temporarily (even though she doesn't like them as much, cause hangover effect, it is just to see if symptoms are less without the Alteril) Reassured that there isn't ongoing infection

## 2010-10-30 NOTE — Patient Instructions (Addendum)
I'm not a fan of longterm use of Valerian use, as I recall hearing of potential liver toxicity.  I think the Melatonin is fine to use longterm.  Consider trying to cut back or try stopping entirely the Alteril, in case that is contributing to the nighttime urinary frequency.  Consider referral to urologist; consider medications such as ditropan or detrol

## 2010-11-13 ENCOUNTER — Telehealth: Payer: Self-pay | Admitting: *Deleted

## 2010-11-13 DIAGNOSIS — M545 Low back pain: Secondary | ICD-10-CM

## 2010-11-13 MED ORDER — OXYCODONE-ACETAMINOPHEN 7.5-325 MG PO TABS: 1.0000 | ORAL_TABLET | Freq: Four times a day (QID) | ORAL | Status: DC | PRN

## 2010-11-13 NOTE — Telephone Encounter (Signed)
Sugar elevations from cortisone shots are usually short-lived and don't need any additional treatment. Rx printed.  Please bring back for me to sign and advise patient

## 2010-11-13 NOTE — Telephone Encounter (Signed)
Patient notifed that sugar elevations from cortisone shots are usually short lived and don't need any additional treatment. Also notified her that her rx was ready for pick up.

## 2010-11-13 NOTE — Telephone Encounter (Signed)
Patient called to inform you that she has B/L cortisone injections in her knees yesterday and the doctor told her that her blood sugars could "sky rocket." Patient wanted to know what she should do, medication wise if that should happen. Sugars are ok at the moment, she is just wondering in case. Also can you write her rx for oxycodone 7.5/325, she will pick up when ready.Thanks.

## 2010-12-02 LAB — DIFFERENTIAL
Basophils Absolute: 0
Basophils Relative: 0
Eosinophils Absolute: 0.2
Neutro Abs: 6.7
Neutrophils Relative %: 83 — ABNORMAL HIGH

## 2010-12-02 LAB — COMPREHENSIVE METABOLIC PANEL
ALT: 14
Alkaline Phosphatase: 69
BUN: 9
CO2: 21
GFR calc non Af Amer: >60
Glucose, Bld: 130 — ABNORMAL HIGH
Potassium: 3.9
Sodium: 140

## 2010-12-02 LAB — LIPASE, BLOOD: Lipase: 25

## 2010-12-02 LAB — CBC
HCT: 39.6
Hemoglobin: 13.1
MCV: 84.4
Platelets: 255
RDW: 14

## 2010-12-02 LAB — URINALYSIS, ROUTINE W REFLEX MICROSCOPIC
Bilirubin Urine: NEGATIVE
Ketones, ur: 15 — AB
Nitrite: NEGATIVE
Protein, ur: NEGATIVE
Urobilinogen, UA: 0.2

## 2010-12-02 LAB — D-DIMER, QUANTITATIVE: D-Dimer, Quant: 1.41 — ABNORMAL HIGH

## 2010-12-18 ENCOUNTER — Encounter (HOSPITAL_BASED_OUTPATIENT_CLINIC_OR_DEPARTMENT_OTHER): Payer: Medicare HMO | Admitting: Oncology

## 2010-12-18 ENCOUNTER — Other Ambulatory Visit: Payer: Self-pay | Admitting: Oncology

## 2010-12-18 DIAGNOSIS — C4442 Squamous cell carcinoma of skin of scalp and neck: Secondary | ICD-10-CM

## 2010-12-18 LAB — CBC WITH DIFFERENTIAL/PLATELET
BASO%: 0.3 % (ref 0.0–2.0)
Basophils Absolute: 0 10*3/uL (ref 0.0–0.1)
EOS%: 3.1 % (ref 0.0–7.0)
HGB: 15.7 g/dL (ref 11.6–15.9)
MCH: 30.4 pg (ref 25.1–34.0)
MCHC: 33.9 g/dL (ref 31.5–36.0)
MCV: 89.6 fL (ref 79.5–101.0)
MONO%: 6.1 % (ref 0.0–14.0)
RBC: 5.17 10*6/uL (ref 3.70–5.45)
RDW: 13.5 % (ref 11.2–14.5)

## 2010-12-18 LAB — MORPHOLOGY

## 2010-12-22 ENCOUNTER — Telehealth: Payer: Self-pay

## 2010-12-22 NOTE — Telephone Encounter (Signed)
pt notified per Dr Reece Agar - "platelets slightly lower, but no need for RX. repeat in Dec. pt reports she is having lab draw at Dr Bea Laura Knapp's office late Nov/early Dec and will send results to our office. dph

## 2010-12-29 ENCOUNTER — Other Ambulatory Visit: Payer: Self-pay | Admitting: *Deleted

## 2010-12-29 DIAGNOSIS — M545 Low back pain: Secondary | ICD-10-CM

## 2010-12-29 MED ORDER — OXYCODONE-ACETAMINOPHEN 7.5-325 MG PO TABS: 1.0000 | ORAL_TABLET | Freq: Four times a day (QID) | ORAL | Status: DC | PRN

## 2011-01-13 ENCOUNTER — Other Ambulatory Visit: Payer: Medicare HMO

## 2011-01-13 ENCOUNTER — Other Ambulatory Visit: Payer: PRIVATE HEALTH INSURANCE

## 2011-01-13 DIAGNOSIS — E7800 Pure hypercholesterolemia, unspecified: Secondary | ICD-10-CM

## 2011-01-13 DIAGNOSIS — E119 Type 2 diabetes mellitus without complications: Secondary | ICD-10-CM

## 2011-01-13 DIAGNOSIS — D509 Iron deficiency anemia, unspecified: Secondary | ICD-10-CM

## 2011-01-13 LAB — COMPREHENSIVE METABOLIC PANEL
Alkaline Phosphatase: 48 U/L (ref 39–117)
BUN: 25 mg/dL — ABNORMAL HIGH (ref 6–23)
Glucose, Bld: 95 mg/dL (ref 70–99)
Sodium: 140 mEq/L (ref 135–145)
Total Bilirubin: 0.6 mg/dL (ref 0.3–1.2)
Total Protein: 6.2 g/dL (ref 6.0–8.3)

## 2011-01-13 LAB — LIPID PANEL
HDL: 80 mg/dL (ref >39)
Total CHOL/HDL Ratio: 3 Ratio
VLDL: 22 mg/dL (ref 0–40)

## 2011-01-14 LAB — CBC WITH DIFFERENTIAL/PLATELET
Eosinophils Absolute: 0.2 10*3/uL (ref 0.0–0.7)
Eosinophils Relative: 3 % (ref 0–5)
Hemoglobin: 15.3 g/dL — ABNORMAL HIGH (ref 12.0–15.0)
Lymphs Abs: 1.2 10*3/uL (ref 0.7–4.0)
MCH: 30 pg (ref 26.0–34.0)
MCV: 91.4 fL (ref 78.0–100.0)
Monocytes Absolute: 0.5 10*3/uL (ref 0.1–1.0)
Monocytes Relative: 7 % (ref 3–12)
RBC: 5.1 MIL/uL (ref 3.87–5.11)

## 2011-01-14 LAB — HEMOGLOBIN A1C: Mean Plasma Glucose: 111 mg/dL (ref <117)

## 2011-01-15 ENCOUNTER — Encounter: Payer: Self-pay | Admitting: Family Medicine

## 2011-01-15 ENCOUNTER — Ambulatory Visit (INDEPENDENT_AMBULATORY_CARE_PROVIDER_SITE_OTHER): Payer: Medicare HMO | Admitting: Family Medicine

## 2011-01-15 DIAGNOSIS — M810 Age-related osteoporosis without current pathological fracture: Secondary | ICD-10-CM

## 2011-01-15 DIAGNOSIS — E119 Type 2 diabetes mellitus without complications: Secondary | ICD-10-CM

## 2011-01-15 DIAGNOSIS — E785 Hyperlipidemia, unspecified: Secondary | ICD-10-CM

## 2011-01-15 DIAGNOSIS — G4700 Insomnia, unspecified: Secondary | ICD-10-CM

## 2011-01-15 DIAGNOSIS — D696 Thrombocytopenia, unspecified: Secondary | ICD-10-CM

## 2011-01-15 DIAGNOSIS — E7800 Pure hypercholesterolemia, unspecified: Secondary | ICD-10-CM

## 2011-01-15 NOTE — Progress Notes (Signed)
Patient presents for follow up on her medical problems.  Diabetes: Sugars have been running 85-90.  She thought maybe her glucometer wasn't working, so she did a home A1c and got 5.2.  She is "semi" on Atkins diet, hasn't lost weight.  Not exercising.  Doing some upper body exercises.  She stubbed her toe on the exercise bike, so she had her husband move it out of the house.  She is considering moving it back in (somewhere else in house) so she can restart using exercise bicycle.  She brings in boxes of Alteril (which she is no longer taking) and of Melissa sleep aid (which contains various B vitamins, Mg, lemon balm, chamomile and L-theanine).  She finds that the [place] aid works very well, in fact better than the Alteril did (I had recommended stopping the Alteril due to it containing valerian root, and she was taking longterm)  She brings in a bottle of Tramadol 50mg  from Dr. Luiz Blare.  She had significant knee pain early in November.  Took it for just a few days, and pain seemed to get better on its own, no longer needing.  Mainly occurs when she sleeps on her left side.  The oxycodone didn't seem to help at all with the knee pain.  She has gotten cortisone shot in both knees in the past, which didn't seem to do much.  She is treating medically until she has knee replacement surgery.  She may be moving back to [facility] in mid-March.  She wants to know her blood test results--she is bruising easily, especially on her lower legs. She is followed by Dr. Cyndie Chime for her low platelets. She has been off of Lovastatin for 3 months, to see if that is contributing to thrombocytopenia.  Had also discussed with him the potential of Flecainide as a cause for low platelets, but she discussed this with Dr. Sw[place], doing so well on it, and prefers not to change the Flecainide.  Dr. Algie Coffer changed her from alendronate to [facility], and is looking into Reclast, but having trouble finding out what the actual cost will be for  her.  Didn't have problems on the Alendronate, and the Atelvia is costing her $85/month  Labs done prior to appt: Lab Results  Component Value Date   CHOL 237* 01/13/2011   HDL 80 01/13/2011   LDLCALC 135* 01/13/2011   TRIG 108 01/13/2011   CHOLHDL 3.0 01/13/2011   Lab Results  Component Value Date   HGBA1C 5.5 01/13/2011     Chemistry      Component Value Date/Time   NA 140 01/13/2011 0905   K 4.2 01/13/2011 0905   CL 102 01/13/2011 0905   CO2 29 01/13/2011 0905   BUN 25* 01/13/2011 0905   CREATININE 0.87 01/13/2011 0905   CREATININE 0.69 08/25/2010 1535      Component Value Date/Time   CALCIUM 8.8 01/13/2011 0905   ALKPHOS 48 01/13/2011 0905   AST 22 01/13/2011 0905   ALT 18 01/13/2011 0905   BILITOT 0.6 01/13/2011 0905      Lab Results  Component Value Date   WBC 7.2 01/13/2011   HGB 15.3* 01/13/2011   HCT 46.6* 01/13/2011   MCV 91.4 01/13/2011   PLT 54* 01/13/2011   ROS:  Denies fever, URI symptoms, cough, shortness of breath, chest pain, neuro symptoms. +easy bruising, but no other bleeding.  Moods are good.  Sleeping well.  Needing pain pill every night, but not during the day.  PHYSICAL EXAM:  BP 116/76  Pulse 68  Ht 5\' 3"  (1.6 m)  Wt 233 lb (105.688 kg)  BMI 41.27 kg/m2 Well developed, pleasant obese female in no distress Neck: no lymphadenopathy or mass Heart: regular rate and rhythm without murmur Lungs: clear bilaterally Abdomen: soft, nontender Extremities: no edema Skin: scattered ecchymosis, at various stages of healing on arms and legs  ASSESSMENT/PLAN: 1. Pure hypercholesterolemia   2. Hyperlipidemia   3. Thrombocytopenia   4. Insomnia   5. Osteoporosis   6. Type II or unspecified type diabetes mellitus without mention of complication, not stated as uncontrolled    Thrombocytopenia--no improvement being off Lovastatin.  Lipids are worse.  Restart Lovastatin.  Will need to discuss potential knee replacement surgery with the hematologist  (due to her low platelets)  Osteoporosis--discussed that bisphosphonates are generally about the same in efficacy; often times Reclast infusion is better covered by Medicare; only reason to use Atelvia (and pay more) is if she has trouble taking it on an empty stomach--otherwise, she can go back to alendronate; she can discuss this in more detail with her GYN who is managing her osteoporosis  Diabetes--controlled.  Weight loss is strongly encouraged.  Encouraged at least 30 minutes of exercise daily--I recommend restarting exercise bike at very low resistance, and to stop if causing increased knee pain

## 2011-01-15 NOTE — Patient Instructions (Signed)
Restart Lovastatin. Please try and exercise at least 30-45 daily You can ask Dr. Cyndie Chime if he thinks you still need iron (hemoglobin has been normal) Keep all other medications the same (ie. Metformin)

## 2011-02-05 ENCOUNTER — Telehealth: Payer: Self-pay | Admitting: *Deleted

## 2011-02-05 DIAGNOSIS — M545 Low back pain, unspecified: Secondary | ICD-10-CM

## 2011-02-05 MED ORDER — OXYCODONE-ACETAMINOPHEN 7.5-325 MG PO TABS: 1.0000 | ORAL_TABLET | Freq: Four times a day (QID) | ORAL | Status: DC | PRN

## 2011-02-05 NOTE — Telephone Encounter (Signed)
Chart reviewed; last filled 11/12.  Printed. Please mail.

## 2011-02-05 NOTE — Telephone Encounter (Signed)
Patient called in for refill on her Percocet, if we could mail out to patient today she would appreciate it. Thanks.

## 2011-02-05 NOTE — Telephone Encounter (Signed)
Called patient to let her know that I am putting rx in mail.

## 2011-02-21 ENCOUNTER — Telehealth: Payer: Self-pay | Admitting: Oncology

## 2011-02-21 NOTE — Telephone Encounter (Signed)
pt aware of 05/08/11 appt,per mosaiq    aom

## 2011-02-28 ENCOUNTER — Telehealth: Payer: Self-pay | Admitting: Oncology

## 2011-02-28 NOTE — Telephone Encounter (Signed)
Moved 3/22 appt to 1/28 w/LT. appt should be jan w/LT. lmonvm for pt re change w/new d/t. Schedule mailed.

## 2011-03-08 ENCOUNTER — Other Ambulatory Visit: Payer: Self-pay | Admitting: Cardiology

## 2011-03-16 ENCOUNTER — Other Ambulatory Visit: Payer: Medicare Other | Admitting: Lab

## 2011-03-16 ENCOUNTER — Telehealth: Payer: Self-pay | Admitting: Oncology

## 2011-03-16 ENCOUNTER — Ambulatory Visit (HOSPITAL_BASED_OUTPATIENT_CLINIC_OR_DEPARTMENT_OTHER): Payer: Medicare Other | Admitting: Nurse Practitioner

## 2011-03-16 VITALS — BP 129/79 | HR 94 | Temp 98.6°F | Ht 63.0 in | Wt 239.8 lb

## 2011-03-16 DIAGNOSIS — D696 Thrombocytopenia, unspecified: Secondary | ICD-10-CM

## 2011-03-16 LAB — CBC WITH DIFFERENTIAL/PLATELET
BASO%: 0.7 % (ref 0.0–2.0)
EOS%: 4.7 % (ref 0.0–7.0)
HCT: 46.4 % (ref 34.8–46.6)
LYMPH%: 20.6 % (ref 14.0–49.7)
MCH: 30 pg (ref 25.1–34.0)
MCHC: 33.5 g/dL (ref 31.5–36.0)
MONO#: 0.6 10*3/uL (ref 0.1–0.9)
NEUT%: 66.6 % (ref 38.4–76.8)
Platelets: 60 10*3/uL — ABNORMAL LOW (ref 145–400)

## 2011-03-16 NOTE — Progress Notes (Signed)
OFFICE PROGRESS NOTE  Interval history:  Ms. 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 She is seen today for scheduled followup.  Ms. Godbee denies bleeding. She continues to note easy bruising. She is considering a knee replacement.   Objective: Blood pressure 129/79, pulse 94, temperature 98.6 F (37 C), temperature source Oral, height 5\' 3"  (1.6 m), weight 239 lb 12.8 oz (108.773 kg).  Oropharynx is without thrush or ulceration. No palpable cervical, supraclavicular or axillary lymph nodes. Lungs are clear. No wheezes or rales. Regular cardiac rhythm. Abdomen is soft and nontender. Obese. No obvious splenomegaly. Extremities are without edema. At the right lower leg the skin has a purplish/red discoloration. Pedal pulses intact.  Lab Results: Lab Results  Component Value Date   WBC 7.9 03/16/2011   HGB 15.5 03/16/2011   HCT 46.4 03/16/2011   MCV 89.5 03/16/2011   PLT 60* 03/16/2011    Chemistry:    Chemistry      Component Value Date/Time   NA 140 01/13/2011 0905   K 4.2 01/13/2011 0905   CL 102 01/13/2011 0905   CO2 29 01/13/2011 0905   BUN 25* 01/13/2011 0905   CREATININE 0.87 01/13/2011 0905   CREATININE 0.69 08/25/2010 1535      Component Value Date/Time   CALCIUM 8.8 01/13/2011 0905   ALKPHOS 48 01/13/2011 0905   AST 22 01/13/2011 0905   ALT 18 01/13/2011 0905   BILITOT 0.6 01/13/2011 0905       Studies/Results: No results found.  Medications: I have reviewed the patient's current medications.  Assessment/Plan:  1. 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 2. History of iron deficiency anemia. Resolved. 3. 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  Disposition-the platelet count remains stable. She will return in 3 months for a CBC and in 6 months for a followup visit. She will notify the office should she decide to undergo a knee replacement. We discussed with her today  that steroids would be initiated several weeks prior to surgery in an effort to raise the platelet count to greater than 80,000.  Patient seen with Dr. Cyndie Chime.  Lonna Cobb ANP/GNP-BC

## 2011-03-16 NOTE — Telephone Encounter (Signed)
gv pt appt schedule for April and July.

## 2011-03-17 ENCOUNTER — Telehealth: Payer: Self-pay | Admitting: *Deleted

## 2011-03-17 NOTE — Telephone Encounter (Signed)
Called pt & she reported that Dr Cyndie Chime had already given her this message.

## 2011-03-17 NOTE — Telephone Encounter (Signed)
Message copied by Sabino Snipes on Tue Mar 17, 2011 12:57 PM ------      Message from: Levert Feinstein      Created: Mon Mar 16, 2011 12:25 PM       Call pt platelet count stable at 60,000

## 2011-03-19 ENCOUNTER — Telehealth: Payer: Self-pay | Admitting: Family Medicine

## 2011-03-19 DIAGNOSIS — M545 Low back pain: Secondary | ICD-10-CM

## 2011-03-19 MED ORDER — OXYCODONE-ACETAMINOPHEN 7.5-325 MG PO TABS: 1.0000 | ORAL_TABLET | Freq: Four times a day (QID) | ORAL | Status: DC | PRN

## 2011-03-19 NOTE — Telephone Encounter (Signed)
printed

## 2011-03-19 NOTE — Telephone Encounter (Signed)
Mailed to pt

## 2011-04-22 ENCOUNTER — Telehealth: Payer: Self-pay | Admitting: *Deleted

## 2011-04-22 NOTE — Telephone Encounter (Signed)
Pt. called & states she would like to cancel lab appt for 06/02/11 since she has an appt with PCP that same week & will have lab done there & sent to [place].  Will route this message to Rose/Scheduler.

## 2011-04-29 ENCOUNTER — Other Ambulatory Visit: Payer: Self-pay | Admitting: Family Medicine

## 2011-04-29 ENCOUNTER — Other Ambulatory Visit: Payer: Self-pay | Admitting: *Deleted

## 2011-04-29 DIAGNOSIS — E7800 Pure hypercholesterolemia, unspecified: Secondary | ICD-10-CM

## 2011-04-29 DIAGNOSIS — E119 Type 2 diabetes mellitus without complications: Secondary | ICD-10-CM

## 2011-04-29 DIAGNOSIS — M545 Low back pain: Secondary | ICD-10-CM

## 2011-04-29 MED ORDER — OXYCODONE-ACETAMINOPHEN 7.5-325 MG PO TABS: 1.0000 | ORAL_TABLET | Freq: Four times a day (QID) | ORAL | Status: DC | PRN

## 2011-04-29 MED ORDER — METFORMIN HCL 500 MG PO TABS: 500.0000 mg | ORAL_TABLET | Freq: Two times a day (BID) | ORAL | Status: AC

## 2011-04-29 MED ORDER — LOVASTATIN 40 MG PO TABS: 40.0000 mg | ORAL_TABLET | Freq: Every day | ORAL | Status: AC

## 2011-04-29 NOTE — Telephone Encounter (Signed)
Ok to print

## 2011-04-29 NOTE — Telephone Encounter (Signed)
Patient called and stated that she needs her oxycodone script. Thanks.

## 2011-05-08 ENCOUNTER — Ambulatory Visit: Payer: Medicare HMO | Admitting: Oncology

## 2011-05-08 ENCOUNTER — Other Ambulatory Visit: Payer: Medicare HMO | Admitting: Lab

## 2011-05-25 ENCOUNTER — Encounter: Payer: Self-pay | Admitting: Family Medicine

## 2011-05-25 ENCOUNTER — Ambulatory Visit (INDEPENDENT_AMBULATORY_CARE_PROVIDER_SITE_OTHER): Payer: Medicare Other | Admitting: Family Medicine

## 2011-05-25 VITALS — BP 120/74 | HR 88 | Ht 63.0 in | Wt 246.0 lb

## 2011-05-25 DIAGNOSIS — M545 Low back pain: Secondary | ICD-10-CM

## 2011-05-25 DIAGNOSIS — E7800 Pure hypercholesterolemia, unspecified: Secondary | ICD-10-CM

## 2011-05-25 DIAGNOSIS — R1310 Dysphagia, unspecified: Secondary | ICD-10-CM

## 2011-05-25 DIAGNOSIS — E119 Type 2 diabetes mellitus without complications: Secondary | ICD-10-CM

## 2011-05-25 DIAGNOSIS — K449 Diaphragmatic hernia without obstruction or gangrene: Secondary | ICD-10-CM

## 2011-05-25 DIAGNOSIS — D696 Thrombocytopenia, unspecified: Secondary | ICD-10-CM

## 2011-05-25 MED ORDER — ESOMEPRAZOLE MAGNESIUM 40 MG PO CPDR: 40.0000 mg | DELAYED_RELEASE_CAPSULE | Freq: Every day | ORAL | Status: AC

## 2011-05-25 MED ORDER — OXYCODONE-ACETAMINOPHEN 7.5-325 MG PO TABS: 1.0000 | ORAL_TABLET | Freq: Four times a day (QID) | ORAL | Status: DC | PRN

## 2011-05-25 NOTE — Patient Instructions (Signed)
Dysphagia with known hiatal hernia and esophageal erosions noted last year on EGD by Dr. Loreta Ave.   Start taking Nexium 40mg  --use daily for at least 2 weeks or up to 2 months.  If symptoms aren't improving, will need further evaluation either with barium swallow or repeat endoscopy (will need to occur in [facility]).  Hold off on taking the Atelvia for a couple of weeks--not to be used if having trouble swallowing for risk of esophageal problems.  Don't use the Tramadol, since it wasn't effective, okay to use oxycodone for pain during your travels, especially since you aren't going to be driving. If it makes you too sleepy, can always cut the tablet in 1/2.  You will need to schedule an appointment with your physician in [place] for labs within the next 2 months, along with your get re-established visit.  Try and avoid taking anti-inflammatories like motrin or Aleve, due to your low platelets

## 2011-05-25 NOTE — Progress Notes (Signed)
Patient presents for some med refills prior to moving to [place] (moving this week).  She was scheduled for CPE with fasting labs in the coming weeks.  She is not fasting today, and declines any labs.   She complains of food starting to get stuck when swallowing in the last couple of months. Last endoscopy was in May, prior to having any symptoms of dysphagia. Denies heartburn/reflux.  Foods gets stuck every once in a while, where nothing will go down, waits a few seconds (up to 30) and it resolves on its own.  Once got stuck with cauliflower and ended up throwing it up.  Denies dysphagia with medications. Saw Dr. Loreta Ave 05/2010 and EGD showed erosions midbody and hiatal hernia, otherwise normal and biopsies were normal.  Still has prevacid 15 mg samples, but packed and unavailable.She was given these samples after endoscopy, but due to her lack of symptoms, she never took them. Denies heartburn, and denies abdominal pain, change in bowel habits, blood in stool.  Reports weight gain, and thinks it feels like all the gain was in upper abdomen.    Has Ultram for flare of knee pain from Dr. Nathen May help much when she tried in the past. Wondering if she should take Ultram or Oxycodone for her back/knee pain that she expects with long car ride to [facility]  She will not be driving, just a passenger.  Oxycodone works for pain--needs refill.  Gets 60/month, but has been taking a little more frequently due to increased pain, due to recent lip biopsy.  This was benign, and back to usual routine of pain meds, up to twice daily.  DM--last A1c 5.5 end of November.  Declines bloodwork today. +weight gain, limited exercise.  Denies polyuria/polydipsia.  Complaining of increased bruising--has been moving/packing, but noticed the bruising prior to the packing. Denies any bleeding, nosebleeds, blood in stool, or other bleeding. Has known thrombocytopenia.  Last platelets were 60,000 2 months ago per Dr. Cyndie Chime. Took some  Aleve the other day for knee pain.  Doesn't use NSAIDs routinely.  Past Medical History  Diagnosis Date  . SVT (supraventricular tachycardia)     followed by Dr. Swaziland  . Hyperlipidemia   . Diabetes mellitus   . Osteopenia   . Unspecified vitamin D deficiency   . Back pain   . Arthritis     knees  . Glaucoma 2006    narrow angle, s/p laser treatment  . Shingles 12/2009  . 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 Thrombocytopenia     Past Surgical History  Procedure Date  . Vertebroplasty 8/08    L2  . Tonsillectomy   . Dilation and curettage of uterus     x3  . Cataract extraction   . Cardiovascular stress test 11/22/2006    EF 71%  . Thumb surgery     x3    History   Social History  . Marital Status: Married    Spouse Name: N/A    Number of Children: 3  . Years of Education: N/A   Occupational History  . homemaker    Social History Main Topics  . Smoking status: Former Smoker    Quit date: 02/16/1990  . Smokeless tobacco: Never Used  . Alcohol Use: No  . Drug Use: No  . Sexually Active: Not on file     1 PPD x 20 years   Other Topics Concern  . Not on file  Social History Narrative  . No narrative on file    Family History  Problem Relation Age of Onset  . COPD Mother   . Heart disease Father     MI at 34, CHF  . COPD Brother   . Diabetes Brother   . Cancer Paternal Aunt     ovarian  . Diabetes Brother    Current Outpatient Prescriptions on File Prior to Visit  Medication Sig Dispense Refill  . ATELVIA 35 MG TBEC Take 1 tablet by mouth Once a week.      [name] Sodium 30-100 MG CAPS Take 50 mg by mouth.        . Cholecalciferol (VITAMIN D3) 2000 UNITS TABS Take by mouth.        . Ferrous Sulfate (IRON) 325 (65 FE) MG TABS Take 1 tablet by mouth daily.        . flecainide (TAMBOCOR) 100 MG tablet TAKE 1 TABLET BY MOUTH TWICE DAILY  60 tablet  4  . loperamide (IMODIUM) 2 MG capsule Take 4 mg by mouth  as needed.       Marland Kitchen LORazepam (ATIVAN) 1 MG tablet Take 1 mg by mouth every 8 (eight) hours as needed.       . lovastatin (MEVACOR) 40 MG tablet Take 1 tablet (40 mg total) by mouth at bedtime.  90 tablet  0  . metFORMIN (GLUCOPHAGE) 500 MG tablet Take 1 tablet (500 mg total) by mouth 2 (two) times daily with a meal.  180 tablet  0  . Multiple Vitamins-Minerals (MACULAR VITAMIN BENEFIT PO) Take by mouth. MacularProtect and MacularProtect Plus, taking twice daily (per ophtho)       . DISCONTD: oxyCODONE-acetaminophen (PERCOCET) 7.5-325 MG per tablet Take 1 tablet by mouth every 6 (six) hours as needed for pain.  60 tablet  0  . esomeprazole (NEXIUM) 40 MG capsule Take 1 capsule (40 mg total) by mouth daily.  30 capsule  1  . traMADol (ULTRAM) 50 MG tablet Take 1 tablet by mouth Every 6 hours as needed. Prn pain.  May use 1-2 tablets       Allergies  Allergen Reactions  . Caffeine Other (See Comments)    SVT   ROS:  Denies fevers, URI symptoms, chest pain, shortness of breath, nausea, vomiting, heartburn, bowel changes, melena, hematochezia, nosebleeds or other bleeding. +bruising, dysphagia per HPI.  +back pain, knee pain.  PHYSICAL EXAM: BP 120/74  Pulse 88  Ht 5\' 3"  (1.6 m)  Wt 246 lb (111.585 kg)  BMI 43.58 kg/m2 Well developed, morbidly obese female in no distress Neck: no lymphadenopathy or mass Heart: regular rate and rhythm Lungs: clear bilaterally Abdomen: soft, nontender.  No epigastric tenderness Skin: multiple small ecchymosis on bilateral upper extremities  PHYSICAL EXAM: 1. Type II or unspecified type diabetes mellitus without mention of complication, not stated as uncontrolled    2. Thrombocytopenia    3. LBP (low back pain)  oxyCODONE-acetaminophen (PERCOCET) 7.5-325 MG per tablet  4. Hiatal hernia  esomeprazole (NEXIUM) 40 MG capsule  5. Dysphagia    6. Pure hypercholesterolemia     Dysphagia with known hiatal hernia and esophageal erosions noted last year on EGD by  Dr. Loreta Ave.   Rx nexium 40 30 x 1--use daily for at least 2 weeks or up to 2 months.  If symptoms aren't improving, will need further evaluation either with barium swallow or repeat endoscopy (will need to occur in [facility]).  Hold off on taking the  Atelvia for a couple of weeks--not to be used if having trouble swallowing for risk of esophageal problems.  Hyperlipidemia--due for labs.  Last labs were OFF med (stopped temporarily to see if plts improved--didn't).  Has been back on lovastatin since end of November and is due for labs.  Not fasting today.  Still has 2 months of meds left.  She should set up fasting visit with new doctor in [facility] within the next 2 months.  DM--well controlled per last A1c, but has gained weight.  She declines A1c today--to get with her new physician.  Continue her current meds.  Avoid NSAIDs, due to thrombocytopenia--also discussed need to be careful with tylenol use when also getting acetominophen with her oxycodone.  Schedule fasting visit with her former doctor within the next 2 months to establish

## 2011-06-02 ENCOUNTER — Other Ambulatory Visit: Payer: Medicare Other

## 2011-06-04 ENCOUNTER — Encounter: Payer: Medicare Other | Admitting: Family Medicine

## 2011-06-17 ENCOUNTER — Telehealth: Payer: Self-pay | Admitting: *Deleted

## 2011-06-17 DIAGNOSIS — M545 Low back pain: Secondary | ICD-10-CM

## 2011-06-17 MED ORDER — OXYCODONE-ACETAMINOPHEN 7.5-325 MG PO TABS: 1.0000 | ORAL_TABLET | Freq: Four times a day (QID) | ORAL | Status: AC | PRN

## 2011-06-17 NOTE — Telephone Encounter (Signed)
Patient has been unable to get in with a new doctor. She is still working on it. Would like to know if you would refill her oxycodone and mail to [place] one last time. Thanks. New [address]       [place], [place] [address]

## 2011-06-17 NOTE — Telephone Encounter (Signed)
Last filled 4/8.  Okay to print rx for #[id number], no refill, and mail to [facility]

## 2011-06-18 ENCOUNTER — Telehealth: Payer: Self-pay | Admitting: *Deleted

## 2011-06-18 NOTE — Telephone Encounter (Signed)
Called patient and let her know that rx is being sent out in todays mail.

## 2011-07-30 ENCOUNTER — Telehealth: Payer: Self-pay | Admitting: Family Medicine

## 2011-07-30 NOTE — Telephone Encounter (Signed)
LM

## 2011-09-01 ENCOUNTER — Ambulatory Visit: Payer: Medicare Other | Admitting: Oncology

## 2011-09-01 ENCOUNTER — Other Ambulatory Visit: Payer: Medicare Other | Admitting: Lab

## 2011-09-01 NOTE — Progress Notes (Signed)
66 year old woman I saw here one year ago for anemia and thrombocytopenia. Anemia turned out to be simple iron deficiency and was corrected with oral iron replacement. 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 She kept a followup visit here in January 2013. Platelet count was 60,000. She did not report for her visit here today.  She does need ongoing followup with periodic lab tests. These could be done through her primary care physician's office. 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    CC Dr. Joselyn Arrow; Dr. Josephine Igo; Dr. Peter Sw[place]and

## 2011-09-02 ENCOUNTER — Encounter: Payer: Self-pay | Admitting: Oncology

## 2011-09-02 NOTE — Progress Notes (Signed)
The patient failed to report for her scheduled visit yesterday. I advised her primary care physician. She was kind enough to e-mail me back that this patient has moved to [place] and will be following up with physicians in her new location.

## 2012-03-31 IMAGING — CR DG CHEST 2V
2 series · 2 of 2 positions shown · non-contrast
Comparison: Chest x-ray of 08/17/2006

CLINICAL DATA: Preop for surgery on the thumb tomorrow

CHEST - 2 VIEW

[view not recorded (1 of 2)]
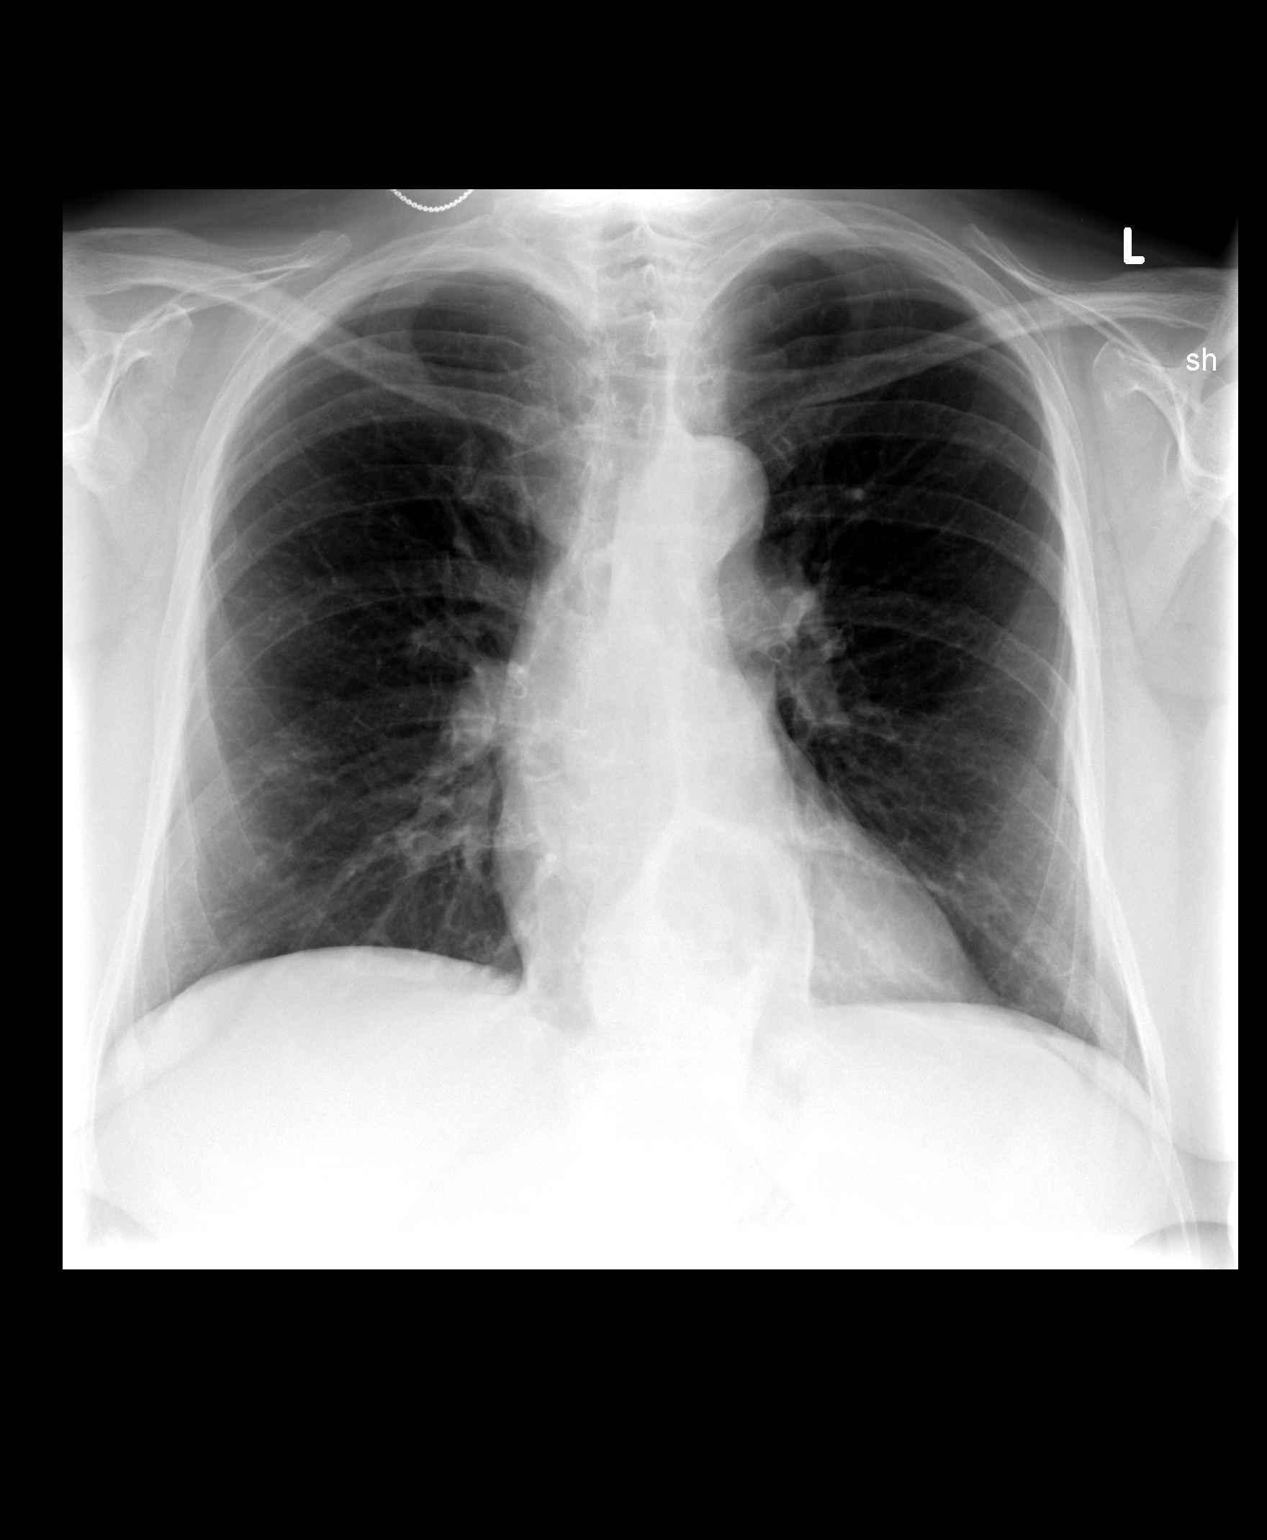

[view not recorded (2 of 2)]
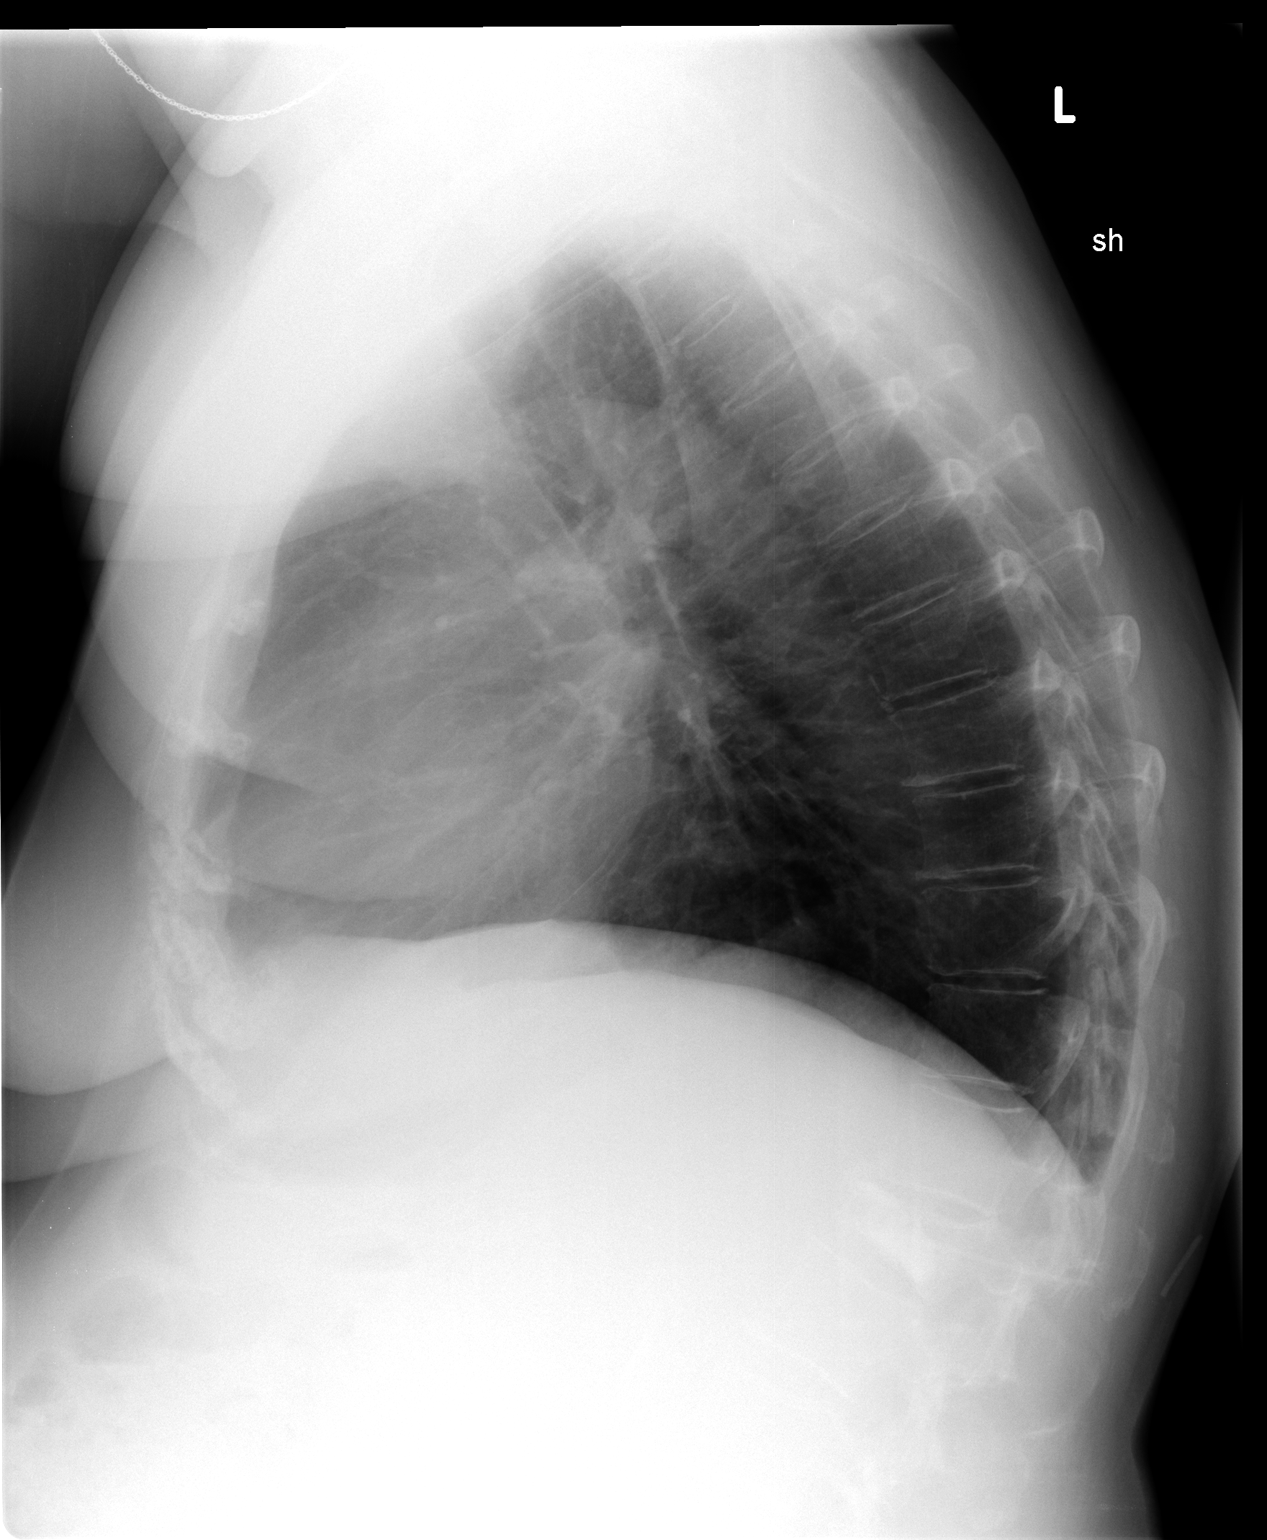

[2 of 2 positions shown; findings below may reference images not displayed]

FINDINGS: The lungs are clear.  Mediastinal contours appear stable.
The heart is within normal limits in size.  A small to moderate
sized hiatal hernia is present.  There is a compressed lower
thoracic vertebral body which appears stable.
IMPRESSION: Stable chest x-ray.  No active lung disease.  Small to moderate
sized hiatal hernia.

## 2014-01-30 ENCOUNTER — Telehealth: Payer: Self-pay | Admitting: Internal Medicine

## 2014-01-30 NOTE — Telephone Encounter (Signed)
Faxed medical records to [facility] @ [phone]
